# Patient Record
Sex: Male | Born: 1955 | Race: White | Hispanic: No | Marital: Married | Smoking: Never smoker
Health system: Southern US, Community
[De-identification: ages and names within clinical notes are randomized; demographics above are authoritative.]

## PROBLEM LIST (undated history)

## (undated) DIAGNOSIS — K602 Anal fissure, unspecified: Secondary | ICD-10-CM

## (undated) DIAGNOSIS — Z9189 Other specified personal risk factors, not elsewhere classified: Secondary | ICD-10-CM

## (undated) DIAGNOSIS — K219 Gastro-esophageal reflux disease without esophagitis: Secondary | ICD-10-CM

## (undated) HISTORY — DX: Gastro-esophageal reflux disease without esophagitis: K21.9

## (undated) HISTORY — PX: KNEE CARTILAGE SURGERY: SHX688

## (undated) HISTORY — DX: Other specified personal risk factors, not elsewhere classified: Z91.89

## (undated) HISTORY — PX: BACK SURGERY: SHX140

## (undated) HISTORY — DX: Anal fissure, unspecified: K60.2

---

## 2004-01-06 ENCOUNTER — Ambulatory Visit (HOSPITAL_COMMUNITY): Admission: RE | Admit: 2004-01-06 | Discharge: 2004-01-06 | Payer: Self-pay | Admitting: Orthopedic Surgery

## 2009-05-31 ENCOUNTER — Emergency Department (HOSPITAL_COMMUNITY): Admission: EM | Admit: 2009-05-31 | Discharge: 2009-05-31 | Payer: Self-pay | Admitting: Family Medicine

## 2009-06-15 ENCOUNTER — Ambulatory Visit: Payer: Self-pay | Admitting: Family Medicine

## 2009-06-15 DIAGNOSIS — Z9189 Other specified personal risk factors, not elsewhere classified: Secondary | ICD-10-CM | POA: Insufficient documentation

## 2009-06-26 ENCOUNTER — Telehealth: Payer: Self-pay | Admitting: Family Medicine

## 2009-07-10 ENCOUNTER — Telehealth: Payer: Self-pay | Admitting: Family Medicine

## 2009-07-10 ENCOUNTER — Ambulatory Visit: Payer: Self-pay | Admitting: Family Medicine

## 2009-07-10 DIAGNOSIS — K602 Anal fissure, unspecified: Secondary | ICD-10-CM

## 2009-07-10 DIAGNOSIS — R05 Cough: Secondary | ICD-10-CM

## 2009-07-11 ENCOUNTER — Telehealth: Payer: Self-pay | Admitting: Family Medicine

## 2009-07-12 ENCOUNTER — Telehealth: Payer: Self-pay | Admitting: Family Medicine

## 2009-07-12 ENCOUNTER — Telehealth (INDEPENDENT_AMBULATORY_CARE_PROVIDER_SITE_OTHER): Payer: Self-pay | Admitting: *Deleted

## 2009-07-13 ENCOUNTER — Ambulatory Visit: Payer: Self-pay | Admitting: Pulmonary Disease

## 2009-07-13 ENCOUNTER — Encounter: Payer: Self-pay | Admitting: Physician Assistant

## 2009-07-13 ENCOUNTER — Ambulatory Visit: Payer: Self-pay | Admitting: Gastroenterology

## 2009-07-13 ENCOUNTER — Telehealth: Payer: Self-pay | Admitting: Gastroenterology

## 2009-07-13 DIAGNOSIS — J984 Other disorders of lung: Secondary | ICD-10-CM

## 2009-07-14 ENCOUNTER — Ambulatory Visit (HOSPITAL_COMMUNITY): Admission: RE | Admit: 2009-07-14 | Discharge: 2009-07-14 | Payer: Self-pay | Admitting: Gastroenterology

## 2009-07-14 ENCOUNTER — Ambulatory Visit: Payer: Self-pay | Admitting: Gastroenterology

## 2009-07-17 ENCOUNTER — Telehealth: Payer: Self-pay | Admitting: Gastroenterology

## 2009-07-18 ENCOUNTER — Telehealth: Payer: Self-pay | Admitting: Gastroenterology

## 2009-07-18 ENCOUNTER — Ambulatory Visit: Payer: Self-pay | Admitting: Internal Medicine

## 2009-07-18 ENCOUNTER — Ambulatory Visit: Payer: Self-pay | Admitting: Pulmonary Disease

## 2009-07-21 ENCOUNTER — Telehealth: Payer: Self-pay | Admitting: Gastroenterology

## 2009-07-21 ENCOUNTER — Ambulatory Visit: Payer: Self-pay | Admitting: Gastroenterology

## 2009-07-24 ENCOUNTER — Telehealth: Payer: Self-pay | Admitting: Gastroenterology

## 2009-07-27 ENCOUNTER — Encounter: Payer: Self-pay | Admitting: Gastroenterology

## 2010-01-24 ENCOUNTER — Encounter: Payer: Self-pay | Admitting: Pulmonary Disease

## 2010-05-01 ENCOUNTER — Encounter: Payer: Self-pay | Admitting: Pulmonary Disease

## 2010-05-22 ENCOUNTER — Ambulatory Visit (HOSPITAL_COMMUNITY)
Admission: RE | Admit: 2010-05-22 | Discharge: 2010-05-22 | Payer: Self-pay | Source: Home / Self Care | Attending: Internal Medicine | Admitting: Internal Medicine

## 2010-06-24 ENCOUNTER — Encounter: Payer: Self-pay | Admitting: Pulmonary Disease

## 2010-07-04 NOTE — Progress Notes (Signed)
Summary: TRIAGE-Rectal pain  Phone Note From Other Clinic   Caller: [name] @ Dr Tawanna Cooler [phone] Call For: ANY GI DR Reason for Call: Schedule Patient Appt Summary of Call: Painful anal fissure. Would like pt worked in asap.  Initial call taken by: Leanor Kail [facility],  July 12, 2009 2:04 PM  Follow-up for Phone Call        Pt. will see Jeremy Garcia [facility] on 07-13-09 at 9am. [name] will advise him of appt/med.list/co-pay. Follow-up by: Laureen Ochs LPN,  July 12, 2009 2:18 PM

## 2010-07-04 NOTE — Assessment & Plan Note (Signed)
Summary: continued cough/dm   Vital Signs:  Patient profile:   55 year old male Weight:      179 pounds Temp:     98.6 degrees F oral BP sitting:   128 / 88  (left arm) Cuff size:   regular  Vitals Entered By: Kern Reap CMA Duncan Dull) (July 10, 2009 11:27 AM)  Reason for Visit cough  History of Present Illness: Jeremy Garcia is a 55 year old male, who comes in today for evaluation of a cough x 5 weeks.  We saw him on January 13.  At that time.  He was wheezing bringing up discolored sputum, and we felt he most likely had a viral syndrome with secondary asthma.  He was treated with prednisone and Biaxin improved however, the cough has persisted.  He has no fever, earache, sore throat.  Sometimes, he will have some discolored sputum.  He has no facial pain.  No history of reflux esophagitis.  He had a rectal fissure for many years.  Recently is getting worse.  I recommend general surgery consult for definitive therapy.  Environmental review of systems negative except for two cats x 8 years   both the sinus films and chest x-ray were normal.  There was a pulmonary nodule in the left upper lobe.  No referred this patient to Dr. Maple Hudson for further evaluation and also get his feedback to see if the CT scan is necessary or follow-up chest x-ray would be adequate  Preventive Screening-Counseling & Management  Alcohol-Tobacco     Smoking Status: never  Caffeine-Diet-Exercise     Does Patient Exercise: yes      Drug Use:  no.    Allergies: No Known Drug Allergies  Past History:  Past medical, surgical, family and social histories (including risk factors) reviewed, and no changes noted (except as noted below).  Past Medical History: Reviewed history from 06/15/2009 and no changes required. hx of Chicken Pox  Past Surgical History: Reviewed history from 06/15/2009 and no changes required. L knee medial meniscus repair-2006 Back [phone]  Family History: Reviewed history from  06/15/2009 and no changes required. CAD-mother triple bypass age 38 HTN-brother DM-no STROKE-nO COLON CA-no PROSTATE CA-father dx:age 72  Social History: Reviewed history and no changes required. Married Never Smoked Alcohol use-no Drug use-no Regular exercise-yes Drug Use:  no Does Patient Exercise:  yes  Review of Systems      See HPI  Physical Exam  General:  Well-developed,well-nourished,in no acute distress; alert,appropriate and cooperative throughout examination Head:  Normocephalic and atraumatic without obvious abnormalities. No apparent alopecia or balding. Eyes:  No corneal or conjunctival inflammation noted. EOMI. Perrla. Funduscopic exam benign, without hemorrhages, exudates or papilledema. Vision grossly normal. Ears:  External ear exam shows no significant lesions or deformities.  Otoscopic examination reveals clear canals, tympanic membranes are intact bilaterally without bulging, retraction, inflammation or discharge. Hearing is grossly normal bilaterally. Nose:  External nasal examination shows no deformity or inflammation. Nasal mucosa are pink and moist without lesions or exudates. Mouth:  Oral mucosa and oropharynx without lesions or exudates.  Teeth in good repair. Neck:  No deformities, masses, or tenderness noted. Chest Wall:  No deformities, masses, tenderness or gynecomastia noted. Lungs:  Normal respiratory effort, chest expands symmetrically. Lungs are clear to auscultation, no crackles or wheezes.   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 Once daily 2)  Aspirin 81 Mg  Tbec (Aspirin) .... Once daily 3)  Grape Seed Xtra Caps (Misc natural products) .... Once daily 4)  Nupercainal 1 % Oint (Dibucaine) .... Apply at bedtime  Patient Instructions: 1)  I will call you  the report on your sinus and chest x-ray. 2)  Because there might be an element of allergy take plain Claritin 10 mg daily to see if this helps the cough. 3)  I would call the general surgery office get an appointment to see Dr. Jamey Ripa, Marolyn Haller about the rectal fissure Prescriptions: NUPERCAINAL 1 % OINT (DIBUCAINE) apply at bedtime  #30 gr x 1   Entered and Authorized by:   Roderick Pee MD   Signed by:   Roderick Pee MD on 07/10/2009   Method used:   [facility] [phone][facility]       [address] [place], [place]  [address]       Ph: [phone] or [phone]       Fax: [phone]   RxID:   [id number]

## 2010-07-04 NOTE — Assessment & Plan Note (Signed)
Summary: new to est//ccm   Vital Signs:  Patient profile:   55 year old male Height:      70 inches Weight:      182 pounds BMI:     26.21 Pulse rate:   89 / minute BP sitting:   122 / 70  (left arm)  Vitals Entered By: Doristine Devoid (June 15, 2009 12:01 PM) CC: NEW EST- cough x3 weeks    CC:  NEW EST- cough x3 weeks .  History of Present Illness: Jeremy Garcia is a 55 y/o married male orig. 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  He is always in excellent health.  Has had no chronic health problems.  He had a tear in his left medial meniscus repaired in 2006 and lumbar back surgery 1999.  He takes no medication on a regular basis except an aspirin tablet.  He also takes a stool softener daily because he has a history of rectal fissure, which constipation will cause a flare of her does not smoke or drinks and occasional drink of alcohol.  Review of systems negative...Marland KitchenMarland KitchenMarland Kitchen  Preventive Screening-Counseling & Management  Alcohol-Tobacco     Smoking Status: never  Caffeine-Diet-Exercise     Caffeine use/day: yes  Allergies (verified): No Known Drug Allergies  Past History:  Past Medical History: hx of Chicken Pox  Past Surgical History: L knee medial meniscus repair-2006 Back L5-S1-1996  Family History: CAD-mother triple bypass age 93 HTN-brother DM-no STROKE-nO COLON CA-no PROSTATE CA-father dx:age 66  Social History: Alcohol:  Less than 3 drinks per week Caffeine use/day:  yes Smoking Status:  never  Physical Exam  General:  Well-developed,well-nourished,in no acute distress; alert,appropriate and cooperative throughout examination Head:  Normocephalic and atraumatic without obvious abnormalities. No apparent alopecia or balding. Eyes:  No corneal or conjunctival  inflammation noted. EOMI. Perrla. Funduscopic exam benign, without hemorrhages, exudates or papilledema. Vision grossly normal. Ears:  External ear exam shows no significant lesions or deformities.  Otoscopic examination reveals clear canals, tympanic membranes are intact bilaterally without bulging, retraction, inflammation or discharge. Hearing is grossly normal bilaterally. Nose:  External nasal examination shows no deformity or inflammation. Nasal mucosa are pink and moist without lesions or exudates. Mouth:  Oral mucosa and oropharynx without lesions or exudates.  Teeth in good repair. Neck:  No deformities, masses, or tenderness noted. Chest Wall:  No deformities, masses, tenderness or gynecomastia noted. Lungs:  Normal respiratory effort, chest expands symmetrically. Lungs are clear to auscultation, no crackles or wheezes.   Problems:  Medical Problems Added: 1)  Dx of Cough  (ICD-786.2) 2)  Dx of Chickenpox, Hx of  (ICD-V15.9)  Impression & Recommendations:  Problem # 1:  COUGH (ICD-786.2) Assessment New  Orders: Prescription Created Electronically [phone])  Complete Medication List: 1)  Multivitamins Caps (Multiple vitamin) .... Once daily 2)  Aspirin 81 Mg Tbec (Aspirin) .... Once daily 3)  Grape Seed Xtra Caps (Misc natural products) .... Once daily 4)  Prednisone 20 Mg Tabs (Prednisone) .... Uad  Patient Instructions: 1)  drink 30 ounces of water daily, run  a vaporizer or humidifier in y  bedroom at night, begin prednisone two tabs x 3 days, one x 3 days, a half x 3 days, then half a tablet Monday, Wednesday, Friday, for a two-week taper.  Return p.r.n.Marland Kitchen 2)  Return October 2011........ annual exam Prescriptions: PREDNISONE 20 MG TABS (PREDNISONE) UAD  #30 x 1   Entered and Authorized by:   Roderick Pee MD   Signed by:   Roderick Pee MD on 06/15/2009   Method used:   [facility]        [facility]  [address] [facility] [phone][facility] ([facility]ail[facility]       [address]        [place], [place]  [address]       Ph: [phone] or [phone]       Fax: [phone]   RxID:   [id number] PREDNISONE 20 MG TABS (PREDNISONE) UAD  #30 x 1   Entered and Authorized by:   Roderick Pee MD   Signed by:   Roderick Pee MD on 06/15/2009   Method used:   Print then Give to Patient   RxID:   [id number]

## 2010-07-04 NOTE — Procedures (Signed)
Summary: Flexible Sigmoidoscopy  Patient: Jeremy Garcia Note: All result statuses are Final unless otherwise noted.  Tests: (1) Flexible Sigmoidoscopy (FLX)  FLX Flexible Sigmoidoscopy                             DONE     [facility]     [address] [place], [place]  [address]           FLEXIBLE SIGMOIDOSCOPY PROCEDURE REPORT           PATIENT:  Jeremy Garcia, Jeremy Garcia  MR#:  [id number]     BIRTHDATE:  06/11/1955, 53 yrs. old  GENDER:  male           ENDOSCOPIST:  Barbette Hair. Arlyce Dice, MD     Referred by:           PROCEDURE DATE:  07/14/2009     PROCEDURE:  Flexible Sigmoidoscopy with Submucosal Injection     ASA CLASS:  Class I     INDICATIONS:  anal fissure           MEDICATIONS:   Fentanyl 100 mcg, Versed 8 mg IV           DESCRIPTION OF PROCEDURE:   After the risks benefits and     alternatives of the procedure were thoroughly explained, informed     consent was obtained.  Digital rectal exam was performed and     revealed sentinel pile.   The EC-[id number] ([id number]) and EC-[id number]     ([id number]) endoscope was introduced through the anus and advanced     to the sigmoid colon, without limitations.  The quality of the     prep was .  The instrument was then slowly withdrawn as the mucosa     was fully examined.     <<PROCEDUREIMAGES>>           Internal hemorrhoids were found (see image001).  A fissure was     found. Anal fissure posterior midline. Proximal and distal end of     fissure were injected with 0 (see image002).5cc (10 units) botox     Retroflexion was not performed.  The scope was then withdrawn from     the patient and the procedure terminated.           COMPLICATIONS:  None           ENDOSCOPIC IMPRESSION:     1) Internal hemorrhoids     2) Anal Fissure - s/p botox injection     RECOMMENDATIONS:     1) continue current meds     2) Call the office to schedule a followup office visit for 2-3     weeks           REPEAT EXAM:  No        ______________________________     Barbette Hair. Arlyce Dice, MD           CC:  Roderick Pee, MD           n.     Rosalie Doctor:   Barbette Hair. Ciarah Peace at 07/14/2009 01:24 PM           Dionicia Abler, [phone]  Note: An exclamation mark (!) indicates a result that was not dispersed into the flowsheet. Document Creation Date: 07/14/2009 1:25 PM _______________________________________________________________________  (1) Order result status: Final Collection or observation date-time: 07/14/2009 13:20 Requested date-time:  Receipt date-time:  Reported date-time:  Referring Physician:   Ordering Physician: Melvia Heaps [phone]) Specimen Source:  Source: Launa Grill Order Number: [id number] Lab site:

## 2010-07-04 NOTE — Procedures (Signed)
Summary: Instructions for procedure/[facility] (out pt)  Instructions for procedure/[facility] (out pt)   Imported By: Sherian Rein 07/17/2009 14:52:07  _____________________________________________________________________  External Attachment:    Type:   Image     Comment:   External Document

## 2010-07-04 NOTE — Letter (Signed)
Summary: [facility]  [address] [place], [place] [address]   Phone: [phone]  Fax: [phone]       Jayvian LEVI    [birth year]    MRN: [id number]        Procedure Day /Date: 07-14-09     Arrival Time: 11:45 AM     Procedure Time: 1:00 PM     Location of Procedure:                     Juliann Pares     [facility] ( Outpatient Registration)   PREPARATION FOR FLEXIBLE SIGMOIDOSCOPY WITH MAGNESIUM CITRATE  Prior to the day before your procedure, purchase one 8 oz. bottle of Magnesium Citrate and one Fleet Enema from the laxative section of your drugstore.  _________________________________________________________________________________________________  THE DAY BEFORE YOUR PROCEDURE             DATE:07-13-09      ONG:EXBMWUXL  1.   Have a clear liquid dinner the night before your procedure.  2.   Do not drink anything colored red or purple.  Avoid juices with pulp.  No orange juice.              CLEAR LIQUIDS INCLUDE: Water Jello Ice Popsicles Tea (sugar ok, no milk/cream) Powdered fruit flavored drinks Coffee (sugar ok, no milk/cream) Gatorade Juice: apple, white grape, white cranberry  Lemonade Clear bullion, consomm, broth Carbonated beverages (any kind) Strained chicken noodle soup Hard Candy   3.   At 7:00 pm the night before your procedure, drink one bottle of Magnesium Citrate over ice.  4.   Drink at least 3 more glasses of clear liquids before bedtime (preferably juices).  5.   Results are expected usually within 1 to 6 hours after taking the Magnesium Citrate.  ___________________________________________________________________________________________________  THE DAY OF YOUR PROCEDURE            DATE: 07-14-09   DAY: Friday  1.   Use Fleet Enema one hour prior to coming for procedure.  2.   You may drink clear liquids until 9:00 AM  (4 hours before exam)       MEDICATION INSTRUCTIONS  Unless  otherwise instructed, you should take regular prescription medications with a small sip of water as early as possible the morning of your procedure.       OTHER INSTRUCTIONS  You will need a responsible adult at least 55 years of age to accompany you and drive you home.   This person must remain in the waiting room during your procedure.  Wear loose fitting clothing that is easily removed.  Leave jewelry and other valuables at home.  However, you may wish to bring a book to read or an iPod/MP3 player to listen to music as you wait for your procedure to start.  Remove all body piercing jewelry and leave at home.  Total time from sign-in until discharge is approximately 2-3 hours.  You should go home directly after your procedure and rest.  You can resume normal activities the day after your procedure.  The day of your procedure you should not:   Drive   Make legal decisions   Operate machinery   Drink alcohol   Return to work  You will receive specific instructions about eating, activities and medications before you leave.   The above instructions have been reviewed and explained to me by   _______________________    I fully understand  and can verbalize these instructions _____________________________ Date _________

## 2010-07-04 NOTE — Progress Notes (Signed)
Summary: continued cough  Phone Note Outgoing Call   Caller: Patient Call For: Roderick Pee MD Reason for Call: Acute Illness Complaint: Cough/Sore throat Action Taken: Provider Notified Summary of Call: Pt is still complaining of cough and states the Prednisone did not help.  [facility]  [phone] Initial call taken by: Lynann Beaver CMA,  June 26, 2009 1:51 PM Summary of Call: GS took the prednisone.  The did really help his cough.  He has no fever, facial pain, etc. no history of reflux esophagitis.  Advised to stop the prednisone begin Biaxin 500 mg b.i.d. for 10 days ov if  the symptoms persist after that Initial call taken by: Roderick Pee MD,  June 26, 2009 2:43 PM    New/Updated Medications: BIAXIN 500 MG TABS (CLARITHROMYCIN) Take 1 tablet by mouth two times a day Prescriptions: BIAXIN 500 MG TABS (CLARITHROMYCIN) Take 1 tablet by mouth two times a day  #20 x 0   Entered and Authorized by:   Roderick Pee MD   Signed by:   Roderick Pee MD on 06/26/2009   Method used:   [facility]        [facility]  [address] [phone][facility] ([facility]ail[facility]       [address] [place], [place]  [address]       Ph: [phone] or [phone]       Fax: [phone]   RxID:   [id number]

## 2010-07-04 NOTE — Assessment & Plan Note (Signed)
Summary: consult for pulmonary nodule.   Copy to:  Kelle Darting Primary Provider/Referring Provider:  Roderick Pee MD  CC:  Pulmonary Consult.  History of Present Illness: The pt is a 55y/o male who I have been asked to see for an incidental pulmonary nodule.  The pt has had a cough since Christmas, and it started as a typical URI.  He had discolored mucus at the time, and he was treated with abx.  The cough has almost totally resolved, and is dry currently.  The pt did have a cxr recently which was clear except for an incidental pulmonary nodule in LUL.  The pt grew up in [place], and is unable to remember when was the last time he had a cxr.  He has no h/o lung disease, and his health maintenance is up to date.  He has no h/o prior cancer.  He has no h/o TB exposure, and is unsure of his ppd status.  His only unsual travel was a mission trip to [place] 2 yrs ago.  He did not become ill on this trip.  Medications Prior to Update: 1)  Multivitamins  Caps (Multiple Vitamin) .... Once Daily 2)  Aspirin 81 Mg Tbec (Aspirin) .... Once Daily 3)  Grape Seed Xtra  Caps (Misc Natural Products) .... Once Daily 4)  Nupercainal 1 % Oint (Dibucaine) .... 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 L knee medial meniscus repair-2006 Back [phone]  Family History: Reviewed history from 06/15/2009 and no changes required. CAD-mother triple bypass age 70 HTN-brother DM-no STROKE-nO COLON CA-no PROSTATE CA-father dx:age 36  Social History: Reviewed history from 07/10/2009 and no changes required. Married smoked "very casually" in highschool x 1 year.  30+ yrs ago.  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  The patient denies shortness of breath with activity, shortness of breath at rest, productive cough, coughing up blood, chest pain, irregular heartbeats, acid heartburn, indigestion, loss of appetite, weight change, abdominal pain, difficulty swallowing, sore throat, tooth/dental problems, headaches, nasal congestion/difficulty breathing through nose, sneezing, itching, ear ache, anxiety, depression, hand/feet swelling, joint stiffness or pain, rash, change in color of mucus, and fever.    Vital Signs:  Patient profile:   55 year old male Height:      70 inches Weight:      180.50 pounds BMI:     25.99 O2 Sat:      97 % on Room air Temp:     98.2 degrees F oral Pulse rate:   84 / minute BP sitting:   140 / 80  (right arm) Cuff size:   regular  Vitals Entered By: Arman Filter LPN (July 13, 2009 10:21 AM)  O2 Flow:  Room air CC: Pulmonary Consult Comments Medications reviewed with patient Arman Filter LPN  July 13, 2009 10:23 AM     Physical Exam  General:  wd male in nad  Eyes:  PERRLA and EOMI.   Nose:  patent without discharge Mouth:  clear  Neck:  no jvd, tmg, LN Lungs:  totally clear to auscultation Heart:  rrr, no mrg Abdomen:  soft and nontender, bs+ Extremities:  no edema, pulses intact distally Neurologic:  alert  and oriented, moves all 4.   Impression & Recommendations:  Problem # 1:  PULMONARY NODULE, LEFT UPPER LOBE (ICD-518.89) the pt has what appears to be a pulmonary nodule in the LUL, but it is possible it is artifactual, and related to bone.  I have explained to pt that he is in a very low risk category, and the nodule is very small.  There are no obvious old cxr's for comparison.  I would recommend following this with surveillance cxr's over a period time, however the pt and his wife are concerned and would like to get a ct chest for re-assurance.  I am ok with this, and would also put the issue to rest rather  this is in the lung or bone.  I would like to check his ppd status if this is indeed in the lung.  Problem # 2:  COUGH (ICD-786.2) the pt's cough has significantly improved, and most c/w a cyclical cough.  I have re-assured him it is not related to his cxr finding.  I have asked him to try various behavioral therapies, and also chlorpheniramine at bedtime for a little while.  Other Orders: Consultation Level IV ([address]) Radiology Referral (Radiology)  Patient Instructions: 1)  will schedule for ct chest.  I will call you with results. 2)  try chlorpheniramine 8mg  at bedtime in the place of claritin 3)  hard candy during the day to bathe the back of your throat.  Limit voice use, no throat clearing. 4)  please come in next week to have ppd place.  Call ahead to let us know when you are coming.

## 2010-07-04 NOTE — Progress Notes (Signed)
Summary: TRIAGE-sooner appt  Phone Note Call from Patient Call back at Home Phone [phone]   Caller: Patient Call For: Arlyce Dice Reason for Call: Talk to Nurse Summary of Call: Patient wants to be seen 2 wks after his procedure (2-11) but next available is on 3-8 and he'll be out of town. Initial call taken by: Tawni Levy,  July 17, 2009 12:12 PM  Follow-up for Phone Call        Pt. will see Dr.Kaplan on 07-28-09 at 2:30pm. Pt. instructed to call back as needed.  Follow-up by: Laureen Ochs LPN,  July 17, 2009 2:18 PM

## 2010-07-04 NOTE — Progress Notes (Signed)
Summary: TRIAGE  Phone Note Call from Patient Call back at Home Phone [phone]   Call For: Jeremy Garcia, [place] Summary of Call: Wonders if he can start his prep at 7pm instead of 9pm today. Has a meeting at work and if he can not make the meeting he must tell his superioor asap. Initial call taken by: Leanor Kail [facility],  July 13, 2009 12:29 PM  Follow-up for Phone Call        Spoke w/pt., he may start prep. at 9pm instead of 7pm tonight. He will call back as needed. Follow-up by: Laureen Ochs LPN,  July 13, 2009 1:35 PM

## 2010-07-04 NOTE — Assessment & Plan Note (Signed)
Summary: RECTAL FISSURE               NEW TO GI           DEBORAH   History of Present Illness Visit Type: Initial Consult Primary GI MD: Melvia Heaps MD [facility] Requesting Provider: Kelle Darting, MD Chief Complaint: Pt staes she has a lot of rectal pain x 2 months. Pt denies any rectal bleeding. He states he has seen some blood a while back but does not remember when. Pt  states the pain has been ongoing for several months but has been getting progressivley worse.  History of Present Illness:   55 YO MALE NEW TO GI TODAY,REFERRED BY DR. TODD. HE HAS HAD A SCREENING COLONOSCOPY ABOUT 3 YEARS AGO BY HIS REPORT,HERE IN [place] BUT DOES NOT REMEMBER WHO HE SAW ( NO RECORD IN E CHART/CORI)  HE COMES HERE TODAY WITH C/O  A NONHEALING FISSURE. HE HAS HAD ANAL FISSURES IN THE PAST WHICH HAVE HEALED. THIS TIME HE HAS HAS SXS FOR 2.5 MONTHS, AND IS NO BETTER. HE IS UNCOMFORTABLE SITTING,USING A DONUT AT WORK. HE IS NOT HAVING ANY BLEEDING. PAIN OBVIOUSLY WORSE POST BMS. HE HAS BEEN DOING SITZ BATHS , AND HAS NUPERCAINE OINTMENT , BUT ON FURTHER QUESTIONING HE HAS ONLY BEEN APPLYING IT EXTERNALLY. HE DENIES ABDOMINAL PAIN, CHANGE IN BOWEL HABITS ETC.   GI Review of Systems      Denies abdominal pain, acid reflux, belching, bloating, chest pain, dysphagia with liquids, dysphagia with solids, heartburn, loss of appetite, nausea, vomiting, vomiting blood, weight loss, and  weight gain.      Reports anal fissure,rectal bleeding, and  rectal pain.     Denies black tarry stools, change in bowel habit, constipation, diarrhea, diverticulosis, fecal incontinence, heme positive stool, hemorrhoids, irritable bowel syndrome, jaundice, light color stool, and  liver problems.    Current Medications (verified): 1)  Multivitamins  Caps (Multiple Vitamin) .... Once Daily 2)  Aspirin 81 Mg Tbec (Aspirin) .... Once Daily 3)  Grape Seed Xtra  Caps (Misc Natural Products) .... Once Daily 4)  Nupercainal 1 % Oint  (Dibucaine) .... Apply At Bedtime  Allergies (verified): No Known Drug Allergies  Past History:  Past Medical History: HX OF ANAL FISSURE  Past Surgical History: Reviewed history from 06/15/2009 and no changes required. L knee medial meniscus repair-2006 Back [phone]  Family History: Reviewed history from 06/15/2009 and no changes required. CAD-mother triple bypass age 78 HTN-brother DM-no STROKE-nO COLON CA-no PROSTATE CA-father dx:age 40  Social History: Reviewed history from 07/10/2009 and no changes required. Married Never Smoked Alcohol use-no Drug use-no Regular exercise-yes  Review of Systems  The patient denies allergy/sinus, anemia, anxiety-new, arthritis/joint pain, back pain, blood in urine, breast changes/lumps, change in vision, confusion, cough, coughing up blood, depression-new, fainting, fatigue, fever, headaches-new, hearing problems, heart murmur, heart rhythm changes, itching, menstrual pain, muscle pains/cramps, night sweats, nosebleeds, pregnancy symptoms, shortness of breath, skin rash, sleeping problems, sore throat, swelling of feet/legs, swollen lymph glands, thirst - excessive , urination - excessive , urination changes/pain, urine leakage, vision changes, and voice change.         OTHERWISE AS IN HPI  Vital Signs:  Patient profile:   55 year old male Height:      70 inches Weight:      178 pounds BMI:     25.63 Pulse rate:   100 / minute Pulse rhythm:   regular BP sitting:   118 / 70  (  right arm) Cuff size:   regular  Vitals Entered By: Christie Nottingham CMA Duncan Dull) (July 13, 2009 8:58 AM)   Physical Exam  General:  Well developed, well nourished, no acute distress. Head:  Normocephalic and atraumatic. Eyes:  PERRLA, no icterus. Lungs:  Clear throughout to auscultation. Heart:  Regular rate and rhythm; no murmurs, rubs,  or bruits. Abdomen:  SOFT, NONTENDER, NO MASS OR HSM,BS+ Rectal:  VERY TENDER ON EXAM,UNABLE TO INSERT  EXAMINING FINGER INTO RECTUM,BELIEVE CAN FEEL A POSTERIOR FISSURE,NO TAG,STOOL NEGATIVE Extremities:  No clubbing, cyanosis, edema or deformities noted. Neurologic:  Alert and  oriented x4;  grossly normal neurologically. Psych:  Alert and cooperative. Normal mood and affect.   Impression & Recommendations:  Problem # 1:  RECTAL FISSURE (ICD-565.0) Assessment New 55 YO MALE WITH SYMPTOMATIC RECTAL PAIN X 2 MONTHS, HX OF RECURRENT ANAL FISSURES.  CONTINUE SITZ BATHS INSRUCTED TO APPLY THE NUPERCAINE GEL INSIDE THE ANUS WITH A GLOVE -2-3 TIMES DAILY SCHEDULE FOR FLEXIBLE SIGMOIDOSCOPYWITH BOTOX INJECTION WITH DR Leeroy Bock DISCUSSED IN DETAIL WITH THE PATIENT OBTAIN OLD RECORDS OF PRIOR COLONOSCOPY ANUSOL HC SUPP AT BEDTIME X 2 WEEKS THE AS NEEDED. Orders: ZFLEX (ZFL)  Patient Instructions: 1)  We have scheduled the Flexible Sigmoidoscopy with Dr. Melvia Heaps for tomorrow 07-14-09.  This will be done at [facility]. 2)  Flexible Sigmoidoscopy instructions and brochure provided. 3)  We have a perscritpion  on hold at your pharmacy for Anusol [facility] Suppositories.  4)  Copy sent to : Dr. Kelle Darting

## 2010-07-04 NOTE — Progress Notes (Signed)
Summary: referral   Phone Note Call from Patient Call back at Home Phone [phone]   Summary of Call: Referal to [facility] for rectal fissure.  My insurance won't anyone around here, but will cover [facility].  In a lot of pain.  Having to sit on a donut.   Says his insurance covers Dr. Jerene Canny [phone], [facility]; at [facility] Dr. Glynis Smiles [phone] Colon & Rectal Surgery; at [facility] Dr. Rosalyn Charters [phone] Colon & Rectal Surgery Initial call taken by: Rudy Jew, RN,  July 11, 2009 8:47 AM  Follow-up for Phone Call        please set him up with a consult to ASAP with our GI folks try to get an appointment today.  Rectal fissure, severe pain Follow-up by: Roderick Pee MD,  July 11, 2009 9:19 AM

## 2010-07-04 NOTE — Progress Notes (Signed)
Summary: Surgical Referral  Phone Note Call from Patient Call back at Home Phone [phone]   Caller: Patient Call For: Arlyce Dice Reason for Call: Talk to Nurse Complaint: Chest Pain Summary of Call: Patient calling with a list of surgeons he can see for his rectal fissure Initial call taken by: Tawni Levy,  July 24, 2009 2:18 PM  Follow-up for Phone Call        Surgeons pt. can see,according to his insurance- Dr.Stan Toni Arthurs [facility]) [phone] Dr.John Beluga ([facility]) [phone] Dr.Christopher Mantyh ([facility]) [phone] Dr.Julie Thacker ([facility]) [phone]  DR.KAPLAN: Pt. wants to know which MD you recommend him to see.  Follow-up by: Laureen Ochs LPN,  July 24, 2009 2:41 PM  Additional Follow-up for Phone Call Additional follow up Details #1::        I don't personally know any of these physicians. Additional Follow-up by: Louis Meckel MD,  July 24, 2009 3:03 PM    Additional Follow-up for Phone Call Additional follow up Details #2::    Pt. will see Dr[facility]Robinson at [facility] on 07-27-09 at 8:30am. He has been instructed to take 2 fleets enemas 1 hour before leaving for the appt. They will mail pt. an information packet. All records have been faxed to [phone] Attn: Burna Mortimer.Pt. instructed to call back as needed.  Follow-up by: Laureen Ochs LPN,  July 24, 2009 3:26 PM   Appended Document: Surgical Referral F/U note--Pt. had surgery on 08-01-09.

## 2010-07-04 NOTE — Progress Notes (Signed)
Summary: Pt needing to get another referral for doctor re: rectal fisure  Phone Note Call from Patient Call back at Home Phone [phone]   Caller: Patient Summary of Call: Pt said that the doctors Dr. Tawanna Cooler referred pt to for rectal fisure are not in network with his insurance. Pt is wanting Dr. Tawanna Cooler to refer him to a doctor that is in pts insurance network. Pt says that Dr. Tawanna Cooler could go on [facility] website, to get referrals of doctors in network.         Initial call taken by: Lucy Antigua,  July 10, 2009 2:31 PM  Follow-up for Phone Call        please call Trey Paula........... have him check  The website and fax me a list of available surgeons with phone numbers that are  in his network Follow-up by: Roderick Pee MD,  July 10, 2009 3:14 PM  Additional Follow-up for Phone Call Additional follow up Details #1::        Phone Call Completed Additional Follow-up by: Kern Reap CMA Duncan Dull),  July 10, 2009 3:27 PM

## 2010-07-04 NOTE — Progress Notes (Signed)
Summary: Triage-Rectal Pain  Phone Note Call from Patient Call back at Home Phone [phone]   Caller: Patient Call For: Arlyce Dice Reason for Call: Talk to Nurse Summary of Call: Patient states that he is still having a lot of pain after his procedure that seems to be increasing wants to know what he should do. Initial call taken by: Tawni Levy,  July 18, 2009 10:37 AM  Follow-up for Phone Call        Pt. had Flex/Botox on 07-14-09. He states the rectal pain is becomming more severe. Worse pain is after a BM. Pain is stabbing/ burning and is only relieved by lying down for 30 mimnutes. He saw some blood in his stool this morning. Stools are soft. He is doing sitz baths 4-6 times daily, using ibuprofen, Nuprucanal oint, tucks pads, nothing is helping the pain.   [facility] P[facility] ADV[facility]  Follow-up by: Laureen Ochs LPN,  July 18, 2009 10:51 AM  Additional Follow-up for Phone Call Additional follow up Details #1::        He needs pain meds; vicodin 10/650 1 tab q6h as needed #25 r/n x 1 Additional Follow-up by: Louis Meckel MD,  July 18, 2009 11:30 AM    Additional Follow-up for Phone Call Additional follow up Details #2::    Above MD orders reviewed with patient. Med. phoned to pt. pharmacy. Pt. to keep scheduled office visit 07-28-09 at 2:15pm. Pt. instructed to call back as needed.  Follow-up by: Laureen Ochs LPN,  July 18, 2009 11:46 AM  New/Updated Medications: HYDROCODONE-ACETAMINOPHEN 10-650 MG TABS (HYDROCODONE-ACETAMINOPHEN) Take one by mouth every 6 hours as needed for pain. Prescriptions: NUPERCAINAL 1 % OINT (DIBUCAINE) apply at bedtime  #30 gr x 1   Entered by:   Laureen Ochs LPN   Authorized by:   Louis Meckel MD   Signed by:   Laureen Ochs LPN on 24/40/1027   Method used:   [facility] to        [facility] [phone]* (retail)       [address]       [place], [place]  [address]       Ph: [phone] or  [phone]       Fax: [phone]   RxID:   [id number]

## 2010-07-04 NOTE — Progress Notes (Signed)
Summary: triage  Phone Note Call from Patient Call back at Home Phone [phone]   Caller: Patient Call For: Arlyce Dice Reason for Call: Talk to Nurse Summary of Call: Patient states that she is still expiriencing a lot of pain and it's ranging from a 2-3 level to a 9-10 level Initial call taken by: Tawni Levy,  July 21, 2009 2:39 PM  Follow-up for Phone Call        Pt. will come to the office right now to see Willette Cluster NP  Follow-up by: Laureen Ochs LPN,  July 21, 2009 3:06 PM

## 2010-07-04 NOTE — Assessment & Plan Note (Signed)
Summary: F/U FROM FISSURE/BOTOX, CONTINUES W/WORSENING PAIN  (DR.KAPLA...   History of Present Illness Visit Type: Follow-up Visit Primary GI MD: Melvia Heaps MD [facility] Primary Provider: Roderick Pee MD Requesting Provider: n/a Chief Complaint: F/u from rectal fissure botox. Pt has worsening pain  History of Present Illness:   Patient had Botox injection of midline posterior anal fissure last Friday. Had mild rectal pain on Saturday and Sunday followed by excruciating pain on Monday. Called office, given Hydrocodone which didn\'t really help. Doesn\'t hurt to have BM but excruciating pain 15 minutes later. Taking stool softeners. Rectal bleeding on Monday x1 but none since. No fevers. Using Nupercaine, taking 4-5 sitz baths / day.    GI Review of Systems      Denies abdominal pain, acid reflux, belching, bloating, chest pain, dysphagia with liquids, dysphagia with solids, heartburn, loss of appetite, nausea, vomiting, vomiting blood, weight loss, and  weight gain.        Denies anal fissure, black tarry stools, change in bowel habit, constipation, diarrhea, diverticulosis, fecal incontinence, heme positive stool, hemorrhoids, irritable bowel syndrome, jaundice, light color stool, liver problems, rectal bleeding, and  rectal pain.    Current Medications (verified): 1)  Multivitamins  Caps (Multiple Vitamin) .... Once Daily 2)  Aspirin 81 Mg Tbec (Aspirin) .... Once Daily 3)  Grape Seed Xtra  Caps (Misc Natural Products) .... Once Daily 4)  Nupercainal 1 % Oint (Dibucaine) .... Apply At Bedtime 5)  Hydrocodone-Acetaminophen 10-650 Mg Tabs (Hydrocodone-Acetaminophen) .... Take One By Mouth Every 6 Hours As Needed For Pain.  Allergies (verified): No Known Drug Allergies  Past History:  Past Medical History: Reviewed history from 07/13/2009 and no changes required.  RECTAL FISSURE (ICD-565.0) CHICKENPOX, HX OF (ICD-V15.9)    Past Surgical History: Reviewed history from 06/15/2009  and no changes required. L knee medial meniscus repair-2006 Back L5-S1-1996  Family History: Reviewed history from 06/15/2009 and no changes required. CAD-mother triple bypass age 75 HTN-brother DM-no STROKE-nO COLON CA-no PROSTATE CA-father dx:age 80  Social History: Reviewed history from 07/13/2009 and no changes required. Married smoked "very casually" in highschool x 1 year.  30+ yrs ago.  Alcohol use-no Drug use-no Regular exercise-yes works as a sales manager  Vital Signs:  Patient profile:   55 year old male Height:      70 inches Weight:      176 pounds BMI:     25.34 BSA:     1.98 Temp:     98 .0 degrees F oral Pulse rate:   88 / minute Pulse rhythm:   regular BP sitting:   132 / 80  (left arm) Cuff size:   regular  Vitals Entered By: Ok Anis CMA (July 21, 2009 3:36 PM)  Physical Exam  General:  Well developed, well nourished, no acute distress. Abdomen:  Abdomen soft, nontender, nondistended. No obvious masses or hepatomegaly.Normal bowel sounds.  Rectal:  Small midline posterior fissure, painful to touch. Anteriorally to the right there is an edematous area resembling inflamed hemorrhoid. No evidence of abscess. Psych:  Alert and cooperative. Normal mood and affect.   Impression & Recommendations:  Problem # 1:  RECTAL FISSURE (ICD-565.0) Assessment Deteriorated S/P Botux injection one week ago. Still in great deal of pain despite Hydrocodone, sitz baths and Nupercaine gel. Will increase pain medication, increase dose of Nupercaine gel. Continue sitz baths 3-4 times a day. He has no local surgeons in his insurance network. He will cal Dr. Marzetta Board nurse with list of surgeons  in [place] and we will make referral.   Patient Instructions: 1)  Please pick up your medications at your pharmacy.  2)  We will call you with surgeon appointment. 3)  Copy sent to : Kelle Darting, MD 4)  The medication list was reviewed and reconciled.  All changed /  newly prescribed medications were explained.  A complete medication list was provided to the patient / caregiver. Prescriptions: PERCOCET 10-650 MG TABS (OXYCODONE-ACETAMINOPHEN) take 1 tablet every 6 hours as needed for pain  #40 x 0   Entered by:   Francee Piccolo CMA (AAMA)   Authorized by:   Willette Cluster NP   Signed by:   Francee Piccolo CMA (AAMA) on 07/21/2009   Method used:   Reprint   RxID:   [id number] PERCOCET 10-650 MG TABS (OXYCODONE-ACETAMINOPHEN) take 1 tablet every 6 hours as needed for pain  #40 x 0   Entered by:   Francee Piccolo CMA (AAMA)   Authorized by:   Willette Cluster NP   Signed by:   Willette Cluster NP on 07/21/2009   Method used:   Print then Give to Patient   RxID:   [id number] XYLOCAINE JELLY 2 % GEL (LIDOCAINE HCL) apply at bedtime  #1 tube x 2   Entered by:   Francee Piccolo CMA (AAMA)   Authorized by:   Willette Cluster NP   Signed by:   Willette Cluster NP on 07/21/2009   Method used:   [facility]  [address] [facility] [phone][facility] (retail[facility]       [address] [place], [place]  [address]       Ph: [phone] or [phone]       Fax: [phone]   RxID:   [id number]

## 2010-07-04 NOTE — Miscellaneous (Signed)
Summary: Orders Update   Clinical Lists Changes  Orders: Added new Referral order of Radiology Referral (Radiology) - Signed 

## 2010-07-04 NOTE — Consult Note (Signed)
Summary: [facility]   Imported By: Sherian Rein 09/07/2009 14:18:24  _____________________________________________________________________  External Attachment:    Type:   Image     Comment:   External Document

## 2010-07-04 NOTE — Miscellaneous (Signed)
Summary: tb skin test placed  Clinical Lists Changes  Orders: Added new Service order of TB Skin Test [phone]) - Signed Added new Service order of Admin 1st Vaccine ([id number]) - Signed Observations: Added new observation of TB-PPD LOT#: [id number] (07/18/2009 15:04) Added new observation of TB-PPD EXP: 09/27/2011 (07/18/2009 15:04) Added new observation of TB-PPD BY: Vernie Murders (07/18/2009 15:04) Added new observation of TB-PPD RTE: ID (07/18/2009 15:04) Added new observation of TB-PPD DSE: 0.1 ml (07/18/2009 15:04) Added new observation of TB-PPD MFR: Sanofi Pasteur (07/18/2009 15:04) Added new observation of TB-PPD SITE: left forearm (07/18/2009 15:04) Added new observation of TB-PPD: PPD (07/18/2009 15:04)      Immunizations Administered:  PPD Skin Test:    Vaccine Type: PPD    Site: left forearm    Mfr: Sanofi Pasteur    Dose: 0.1 ml    Route: ID    Given by: Vernie Murders    Exp. Date: 09/27/2011    Lot #: [id number]  Appended Document: tb skin test placed    Clinical Lists Changes  Observations: Added new observation of TB PPDRESULT: negative (07/20/2009 11:56) Added new observation of PPD RESULT: < 5mm (07/20/2009 11:56) Added new observation of TB-PPD RDDTE: 07/20/2009 (07/20/2009 11:56)       PPD Results    Date of reading: 07/20/2009    Results: < 5mm    Interpretation: negative Arman Filter LPN  July 20, 2009 11:57 AM

## 2010-07-04 NOTE — Progress Notes (Signed)
Summary: ref  Phone Note Call from Patient   Summary of Call: Wants referral ASAP to one of the surgeons on the list.  He's in a lot of pain. Initial call taken by: Rudy Jew, RN,  July 12, 2009 11:59 AM  Follow-up for Phone Call        patient states that he would like to have a consult with GI.  He says is insurance will cover this visit.  he would liked to be seen before any surgery because he would like to be treated if possible.  pt says gi cancelled appointment in the middle of scheduling appointment because his insurance wont cover it. Follow-up by: Kern Reap CMA Duncan Dull),  July 12, 2009 12:31 PM  Additional Follow-up for Phone Call Additional follow up Details #1::        Appt Scheduled. Pt aware. Additional Follow-up by: Corky Mull,  July 12, 2009 2:24 PM

## 2010-07-11 NOTE — Letter (Signed)
Summary: [facility]   Imported By: Lester Necedah 07/05/2010 10:15:07  _____________________________________________________________________  External Attachment:    Type:   Image     Comment:   External Document

## 2010-08-29 ENCOUNTER — Other Ambulatory Visit: Payer: Self-pay | Admitting: Gastroenterology

## 2010-08-29 ENCOUNTER — Ambulatory Visit (HOSPITAL_COMMUNITY)
Admission: RE | Admit: 2010-08-29 | Discharge: 2010-08-29 | Disposition: A | Discharge status: Home / Self Care | Payer: 59 | Source: Ambulatory Visit | Attending: Gastroenterology | Admitting: Gastroenterology

## 2010-08-29 DIAGNOSIS — R05 Cough: Secondary | ICD-10-CM | POA: Insufficient documentation

## 2010-08-29 DIAGNOSIS — Z79899 Other long term (current) drug therapy: Secondary | ICD-10-CM | POA: Insufficient documentation

## 2010-08-29 DIAGNOSIS — K2970 Gastritis, unspecified, without bleeding: Secondary | ICD-10-CM | POA: Insufficient documentation

## 2010-08-29 DIAGNOSIS — K219 Gastro-esophageal reflux disease without esophagitis: Secondary | ICD-10-CM | POA: Insufficient documentation

## 2010-08-29 DIAGNOSIS — K228 Other specified diseases of esophagus: Secondary | ICD-10-CM | POA: Insufficient documentation

## 2010-08-29 DIAGNOSIS — R059 Cough, unspecified: Secondary | ICD-10-CM | POA: Insufficient documentation

## 2010-08-29 DIAGNOSIS — K2289 Other specified disease of esophagus: Secondary | ICD-10-CM | POA: Insufficient documentation

## 2010-10-19 NOTE — Op Note (Signed)
NAME:  Jeremy Garcia, Jeremy Garcia                        ACCOUNT NO.:  [id number]   MEDICAL RECORD NO.:  [id number]                   PATIENT TYPE:  AMB   LOCATION:  DAY                                  FACILITY:  [facility]   PHYSICIAN:  Almedia Balls. Ranell Patrick, M.D.              DATE OF BIRTH:  December 15, 1955   DATE OF PROCEDURE:  01/06/2004  DATE OF DISCHARGE:                                 OPERATIVE REPORT   PREOPERATIVE DIAGNOSES:  Left knee medial meniscus tear.   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   PROCEDURE:  Left knee arthroscopy, debridement of posteromedial medial  meniscus tear as well as abrasion chondroplasty of the medial femoral  condyle.   SURGEON:  Almedia Balls. Ranell Patrick, M.D.   ASSISTANT:  None.   ANESTHESIA:  General anesthesia was used.   ESTIMATED BLOOD LOSS:  Minimal.   FLUIDS REPLACED:  800 mL crystalloid.   INSTRUMENT COUNT:  Correct.   COMPLICATIONS:  None.   ANTIBIOTICS:  Perioperative antibiotics were given.   INDICATIONS FOR PROCEDURE:  The patient is Garcia 55 year old male complaining of  left medial knee pain.  The patient has had mechanical symptoms and pain  refractory to conservative management including activity modification,  therapy, injections and presents now with MRI proven meniscus tear desiring  surgical treatment. Informed consent was obtained.   DESCRIPTION OF PROCEDURE:  After an adequate level of anesthesia was  achieved, the patient positioned supine on the operating room table, the  __________ post was utilized, left leg was sterilely prepped and draped.  Standard arthroscopic portals were created in similar fashion with  infiltration in the skin with 0.25% Marcaine with epinephrine followed by  incision with an 11 blade scalpel and __________ cannula in the joint using  blunt obturators. Diagnostic arthroscopy revealed normal patellofemoral  articular cartilage  with no significant chondromalacia noted.  The medial  femoral condyle was noted to have Garcia large area of approximately 2 x 3 cm of  at least 50% near full-thickness chondral defects with loose chondral flaps.  The chondral flaps and loose cartilage was removed in an abrasion  chondroplasty technique back to stable articular cartilage. There was Garcia  complete posterior horn medial meniscus tear involving the entire posterior  horn of the medial meniscus. About 40-50% of the posterior horn of the  meniscus had to be removed with some femoral surface of the meniscus  remaining intact.  However, most of the tibial surface was debrided using  biting instruments and Garcia full radius resector. At this point, the ACL and  PCL were visualized and noted to be intact, the lateral compartment was  inspected and noted to have some grade 2-3 changes on the tibial surface but  the femoral condyle appeared near normal and the meniscus was normal. Some  hypertrophic synovium was removed from the anterior aspect of the knee  just  to facilitate exposure of the that medial femoral condyle to make sure that  we had that thoroughly debrided to its margin. Again that was about 2 x 3 cm  and appeared articulate with the tibia in mid flexion.  The patient  tolerated the procedure well and was taken to the recovery room in stable  condition.  The wounds were sutured using 4-0 Monocryl subcuticular followed  by Steri-Strips and Garcia sterile dressings.  The patient was taken to the  recovery room and went to cryotherapy.                                               Almedia Balls. Ranell Patrick, M.D.    SRN/MEDQ  D:  01/06/2004  T:  01/07/2004  Job:  [id number]

## 2010-10-31 ENCOUNTER — Telehealth: Payer: Self-pay | Admitting: Pulmonary Disease

## 2010-10-31 NOTE — Telephone Encounter (Signed)
I atcx1 no answer, no voicemail. Carron Curie, CMA

## 2010-11-01 NOTE — Telephone Encounter (Signed)
Called and spoke with pt.  Pt hasn't seen [facility] since his initial consult for pulm nodule on 07/13/2009.  [facility] had ordered pt to have a f/u CT in August, but per documentation by [facility] in [employer], pt had changed his care to [place].  Pt states this was because his insurance had changed and [facility] was no longer in network.  Pt states his ins recently changed again and [facility] is now not in network and therefore pt is going to see [facility] again (appt already scheduled for 6/6)  Pt states while being seen at [facility] he had 2 f/u CT scans done which showed no changed in nodule.  Most recent CT scan was done 6 months ago.  Pt has disc on both of these CT scans and will bring them with to his visit with [facility] on 6/6.  Pt's main question is if [facility] would like pt to have a CT scan prior to the appt with [facility] on 6/6 or wait until after [facility] evaluates him and views prior CT scans.  Pt aware [facility] out of office this afternoon and will return tomorrow.  Pt ok to wait to have message addressed by [facility] tomorrow.

## 2010-11-02 NOTE — Telephone Encounter (Signed)
Pt aware of kc's response and will keep appt for 6/7

## 2010-11-02 NOTE — Telephone Encounter (Signed)
I would like to see him first and review his old scans, then we can talk about his followup scanning.

## 2010-11-06 ENCOUNTER — Encounter: Payer: Self-pay | Admitting: Pulmonary Disease

## 2010-11-08 ENCOUNTER — Ambulatory Visit (INDEPENDENT_AMBULATORY_CARE_PROVIDER_SITE_OTHER): Payer: 59 | Admitting: Pulmonary Disease

## 2010-11-08 ENCOUNTER — Encounter: Payer: Self-pay | Admitting: Pulmonary Disease

## 2010-11-08 VITALS — BP 128/80 | HR 73 | Temp 98.1°F | Ht 71.0 in | Wt 178.4 lb

## 2010-11-08 DIAGNOSIS — J984 Other disorders of lung: Secondary | ICD-10-CM

## 2010-11-08 NOTE — Patient Instructions (Signed)
Will do a followup limited ct chest to check on your spot in left lung.  Will call you with results. If no change, will need one more scan next spring.

## 2010-11-08 NOTE — Assessment & Plan Note (Signed)
The pt has a LUL nodule on ct chest that is unchanged from 07/2009 as of 04/2010.  He is due for a followup ct chest, and I have told him the definition of benign disease is no change at 2 yrs.  Will set this up, and call him with results.

## 2010-11-08 NOTE — Progress Notes (Signed)
  Subjective:    Patient ID: Jeremy Garcia, male    DOB: 1955-08-11, 55 y.o.   MRN: [id number]  HPI The pt comes in today for f/u of his LUL pulmonary nodule.  He has not been seen since 07/2009, and had 2 f/u scans at [facility] since his visit with me which were unchanged (last 04/2010).  He has been doing well with no new symptoms.   Review of Systems  Constitutional: Negative for fever and unexpected weight change.  HENT: Negative for ear pain, nosebleeds, congestion, sore throat, rhinorrhea, sneezing, trouble swallowing, dental problem, postnasal drip and sinus pressure.   Eyes: Negative for redness and itching.  Respiratory: Positive for cough. Negative for chest tightness, shortness of breath and wheezing.   Cardiovascular: Negative for palpitations and leg swelling.  Gastrointestinal: Negative for nausea and vomiting.  Genitourinary: Negative for dysuria.  Musculoskeletal: Negative for joint swelling.  Skin: Negative for rash.  Neurological: Negative for headaches.  Hematological: Does not bruise/bleed easily.  Psychiatric/Behavioral: Negative for dysphoric mood. The patient is not nervous/anxious.        Objective:   Physical Exam Wd male in nad No purulence or discharge noted from nares. LE without edema, no cyanosis Alert, oriented, moves all 4        Assessment & Plan:

## 2010-11-13 ENCOUNTER — Ambulatory Visit (INDEPENDENT_AMBULATORY_CARE_PROVIDER_SITE_OTHER)
Admission: RE | Admit: 2010-11-13 | Discharge: 2010-11-13 | Disposition: A | Discharge status: Home / Self Care | Payer: 59 | Source: Ambulatory Visit | Attending: Pulmonary Disease | Admitting: Pulmonary Disease

## 2010-11-13 ENCOUNTER — Other Ambulatory Visit: Payer: Self-pay | Admitting: Pulmonary Disease

## 2010-11-13 DIAGNOSIS — J984 Other disorders of lung: Secondary | ICD-10-CM

## 2011-05-13 ENCOUNTER — Encounter: Payer: Self-pay | Admitting: Family Medicine

## 2014-07-21 LAB — HEMOGLOBIN A1C: Hemoglobin A1C: 5

## 2015-12-29 LAB — HM COLONOSCOPY

## 2016-08-29 LAB — LIPID PANEL
Cholesterol: 180 (ref 0–200)
HDL: 50 (ref 35–70)
LDL Cholesterol: 102
LDl/HDL Ratio: 2
Triglycerides: 140 (ref 40–160)

## 2016-08-29 LAB — BASIC METABOLIC PANEL
BUN: 17 (ref 4–21)
Creatinine: 1 (ref 0.6–1.3)
Glucose: 86
Potassium: 4.7 (ref 3.4–5.3)
Sodium: 145 (ref 137–147)

## 2016-08-29 LAB — HEPATIC FUNCTION PANEL
ALT: 34 (ref 10–40)
AST: 21 (ref 14–40)
Alkaline Phosphatase: 83 (ref 25–125)
Bilirubin, Total: 0.7

## 2016-08-29 LAB — CBC AND DIFFERENTIAL
HCT: 48 (ref 41–53)
Hemoglobin: 17 (ref 13.5–17.5)
Platelets: 211 (ref 150–399)
WBC: 5.8

## 2016-08-29 LAB — MICROALBUMIN, URINE: Microalb, Ur: 4.4

## 2016-08-29 LAB — PSA: PSA: 0.59

## 2016-08-29 LAB — TSH: TSH: 1.46 (ref 0.41–5.90)

## 2016-09-05 ENCOUNTER — Encounter: Payer: Self-pay | Admitting: Family Medicine

## 2018-11-16 ENCOUNTER — Encounter: Payer: Self-pay | Admitting: Family Medicine

## 2018-11-25 ENCOUNTER — Encounter: Payer: Self-pay | Admitting: Physical Therapy

## 2018-11-26 ENCOUNTER — Encounter: Payer: Self-pay | Admitting: Physical Therapy

## 2018-11-30 ENCOUNTER — Ambulatory Visit: Payer: 59 | Admitting: Family Medicine

## 2018-12-01 ENCOUNTER — Ambulatory Visit (INDEPENDENT_AMBULATORY_CARE_PROVIDER_SITE_OTHER): Payer: PRIVATE HEALTH INSURANCE | Admitting: Family Medicine

## 2018-12-01 ENCOUNTER — Other Ambulatory Visit: Payer: Self-pay

## 2018-12-01 ENCOUNTER — Encounter: Payer: Self-pay | Admitting: Family Medicine

## 2018-12-01 VITALS — BP 110/72 | HR 71 | Temp 98.6°F | Ht 69.5 in | Wt 172.2 lb

## 2018-12-01 DIAGNOSIS — Z131 Encounter for screening for diabetes mellitus: Secondary | ICD-10-CM | POA: Diagnosis not present

## 2018-12-01 DIAGNOSIS — Z23 Encounter for immunization: Secondary | ICD-10-CM | POA: Diagnosis not present

## 2018-12-01 DIAGNOSIS — Z1159 Encounter for screening for other viral diseases: Secondary | ICD-10-CM

## 2018-12-01 DIAGNOSIS — Z0001 Encounter for general adult medical examination with abnormal findings: Secondary | ICD-10-CM | POA: Diagnosis not present

## 2018-12-01 DIAGNOSIS — Z125 Encounter for screening for malignant neoplasm of prostate: Secondary | ICD-10-CM

## 2018-12-01 DIAGNOSIS — N529 Male erectile dysfunction, unspecified: Secondary | ICD-10-CM | POA: Diagnosis not present

## 2018-12-01 DIAGNOSIS — J309 Allergic rhinitis, unspecified: Secondary | ICD-10-CM

## 2018-12-01 DIAGNOSIS — Z13220 Encounter for screening for lipoid disorders: Secondary | ICD-10-CM | POA: Diagnosis not present

## 2018-12-01 LAB — LIPID PANEL
Cholesterol: 158 mg/dL (ref 0–200)
HDL: 50.7 mg/dL (ref >39.00)
LDL Cholesterol: 95 mg/dL (ref 0–99)
NonHDL: 107.21
Total CHOL/HDL Ratio: 3
Triglycerides: 61 mg/dL (ref 0.0–149.0)
VLDL: 12.2 mg/dL (ref 0.0–40.0)

## 2018-12-01 LAB — PSA: PSA: 0.57 ng/mL (ref 0.10–4.00)

## 2018-12-01 LAB — GLUCOSE, RANDOM: Glucose, Bld: 79 mg/dL (ref 70–99)

## 2018-12-01 MED ORDER — TADALAFIL 10 MG PO TABS: 10.0000 mg | ORAL_TABLET | Freq: Every day | ORAL | 0 refills | Status: DC | PRN

## 2018-12-01 NOTE — Progress Notes (Signed)
Chief Complaint:  Jeremy Garcia is a 63 y.o. male who presents today for his annual comprehensive physical exam and to establish care.   Assessment/Plan:  Allergic rhinitis Stable.  Continue Claritin 10 mg daily.  Erectile dysfunction Discussed treatment options.  Will start Cialis 10 mg daily as needed.  Discussed potential side effects.  BMI 25 Discussed lifestyle modifications.   Preventative Healthcare: Check hep C antibody, lipid panel and glucose. Tdap given today.   Patient Counseling(The following topics were reviewed and/or handout was given):  -Nutrition: Stressed importance of moderation in sodium/caffeine intake, saturated fat and cholesterol, caloric balance, sufficient intake of fresh fruits, vegetables, and fiber.  -Stressed the importance of regular exercise.   -Substance Abuse: Discussed cessation/primary prevention of tobacco, alcohol, or other drug use; driving or other dangerous activities under the influence; availability of treatment for abuse.   -Injury prevention: Discussed safety belts, safety helmets, smoke detector, smoking near bedding or upholstery.   -Sexuality: Discussed sexually transmitted diseases, partner selection, use of condoms, avoidance of unintended pregnancy and contraceptive alternatives.   -Dental health: Discussed importance of regular tooth brushing, flossing, and dental visits.  -Health maintenance and immunizations reviewed. Please refer to Health maintenance section.  Return to care in 1 year for next preventative visit.     Subjective:  HPI:  He has no acute complaints today.   He has had some issues with erectile dysfunction over the past couple months.  Has trouble maintaining erection.  Able to get normal erections at first.  No dysuria or hematuria.  No penile discharge.  No pain with intercourse.  Normal libido.  # Allergic Rhinitis - On loratadine 10mg  daily and tolerating well.   Lifestyle Diet: Balanced. Plenty of  fruits and vegetables.  Exercise: Walks 6-7 miles per day.   Depression screen PHQ 2/9 12/01/2018  Decreased Interest 0  Down, Depressed, Hopeless 0  PHQ - 2 Score 0    Health Maintenance Due  Topic Date Due  . Hepatitis C Screening  [birth year]  . HIV Screening  10/[birth year]/1970     ROS: Per HPI, otherwise a complete review of systems was negative.   PMH:  The following were reviewed and entered/updated in epic: Past Medical History:  Diagnosis Date  . Anal fissure   . GERD (gastroesophageal reflux disease)   . Unspecified personal history presenting hazards to health    Patient Active Problem List   Diagnosis Date Noted  . Erectile dysfunction 12/01/2018  . Allergic rhinitis 12/01/2018   Past Surgical History:  Procedure Laterality Date  . BACK SURGERY     Due to sciatica  . KNEE CARTILAGE SURGERY     Arthroscopy   Family History  Problem Relation Age of Onset  . Hypertension Brother   . Prostate cancer Father        62s  . Coronary artery disease Mother     Medications- reviewed and updated Current Outpatient Medications  Medication Sig Dispense Refill  . Loratadine 10 MG CAPS Take by mouth.    . tadalafil (CIALIS) 10 MG tablet Take 1 tablet (10 mg total) by mouth daily as needed for erectile dysfunction. 10 tablet 0   No current facility-administered medications for this visit.     Allergies-reviewed and updated No Known Allergies  Social History   Socioeconomic History  . Marital status: Married    Spouse name: Not on file  . Number of children: Not on file  . Years of education: Not on file  .  Highest education level: Not on file  Occupational History  . Occupation: Transport plannerales manager  Social Needs  . Financial resource strain: Not on file  . Food insecurity    Worry: Not on file    Inability: Not on file  . Transportation needs    Medical: Not on file    Non-medical: Not on file  Tobacco Use  . Smoking status: Never Smoker  Substance and Sexual  Activity  . Alcohol use: Yes  . Drug use: Never  . Sexual activity: Yes  Lifestyle  . Physical activity    Days per week: Not on file    Minutes per session: Not on file  . Stress: Not on file  Relationships  . Social Musicianconnections    Talks on phone: Not on file    Gets together: Not on file    Attends religious service: Not on file    Active member of club or organization: Not on file    Attends meetings of clubs or organizations: Not on file    Relationship status: Not on file  Other Topics Concern  . Not on file  Social History Narrative  . Not on file        Objective:  Physical Exam: BP 110/72 (BP Location: Left Arm, Patient Position: Sitting, Cuff Size: Normal)   Pulse 71   Temp 98.6 F (37 C) (Oral)   Ht 5' 9.5" (1.765 m)   Wt 172 lb 3.2 oz (78.1 kg)   SpO2 99%   BMI 25.06 kg/m   Body mass index is 25.06 kg/m. Wt Readings from Last 3 Encounters:  12/01/18 172 lb 3.2 oz (78.1 kg)  11/08/10 178 lb 6.4 oz (80.9 kg)   Gen: NAD, resting comfortably HEENT: TMs normal bilaterally. OP clear. No thyromegaly noted.  CV: RRR with no murmurs appreciated Pulm: NWOB, CTAB with no crackles, wheezes, or rhonchi GI: Normal bowel sounds present. Soft, Nontender, Nondistended. MSK: no edema, cyanosis, or clubbing noted Skin: warm, dry Neuro: CN2-12 grossly intact. Strength 5/5 in upper and lower extremities. Reflexes symmetric and intact bilaterally.  Psych: Normal affect and thought content     Audrick Lamoureaux M. Jimmey RalphParker, MD 12/01/2018 9:31 AM

## 2018-12-01 NOTE — Patient Instructions (Signed)
It was very nice to see you today!  Please try the Cialis.  Let me know if you have any side effects.  Keep up the good work with your diet and exercise.  We will check blood work today.   Eat at least 3 REAL meals and 1-2 snacks per day.  Aim for no more than 5 hours between eating.  Eat breakfast within one hour of getting up.    Obtain twice as many fruits/vegetables as protein or carbohydrate foods for both lunch and dinner.   Cut down on sweet beverages. This includes juice, soda, and sweet tea.    Exercise at least 150 minutes every week.    Take care, Dr Jerline Pain   Preventive Care 70-19 Years Old, Male Preventive care refers to lifestyle choices and visits with your health care provider that can promote health and wellness. This includes:  A yearly physical exam. This is also called an annual well check.  Regular dental and eye exams.  Immunizations.  Screening for certain conditions.  Healthy lifestyle choices, such as eating a healthy diet, getting regular exercise, not using drugs or products that contain nicotine and tobacco, and limiting alcohol use. What can I expect for my preventive care visit? Physical exam Your health care provider will check:  Height and weight. These may be used to calculate body mass index (BMI), which is a measurement that tells if you are at a healthy weight.  Heart rate and blood pressure.  Your skin for abnormal spots. Counseling Your health care provider may ask you questions about:  Alcohol, tobacco, and drug use.  Emotional well-being.  Home and relationship well-being.  Sexual activity.  Eating habits.  Work and work Statistician. What immunizations do I need?  Influenza (flu) vaccine  This is recommended every year. Tetanus, diphtheria, and pertussis (Tdap) vaccine  You may need a Td booster every 10 years. Varicella (chickenpox) vaccine  You may need this vaccine if you have not already been vaccinated.  Zoster (shingles) vaccine  You may need this after age 66. Measles, mumps, and rubella (MMR) vaccine  You may need at least one dose of MMR if you were born in 1957 or later. You may also need a second dose. Pneumococcal conjugate (PCV13) vaccine  You may need this if you have certain conditions and were not previously vaccinated. Pneumococcal polysaccharide (PPSV23) vaccine  You may need one or two doses if you smoke cigarettes or if you have certain conditions. Meningococcal conjugate (MenACWY) vaccine  You may need this if you have certain conditions. Hepatitis A vaccine  You may need this if you have certain conditions or if you travel or work in places where you may be exposed to hepatitis A. Hepatitis B vaccine  You may need this if you have certain conditions or if you travel or work in places where you may be exposed to hepatitis B. Haemophilus influenzae type b (Hib) vaccine  You may need this if you have certain risk factors. Human papillomavirus (HPV) vaccine  If recommended by your health care provider, you may need three doses over 6 months. You may receive vaccines as individual doses or as more than one vaccine together in one shot (combination vaccines). Talk with your health care provider about the risks and benefits of combination vaccines. What tests do I need? Blood tests  Lipid and cholesterol levels. These may be checked every 5 years, or more frequently if you are over 78 years old.  Hepatitis C  test.  Hepatitis B test. Screening  Lung cancer screening. You may have this screening every year starting at age 4 if you have a 30-pack-year history of smoking and currently smoke or have quit within the past 15 years.  Prostate cancer screening. Recommendations will vary depending on your family history and other risks.  Colorectal cancer screening. All adults should have this screening starting at age 47 and continuing until age 49. Your health care  provider may recommend screening at age 15 if you are at increased risk. You will have tests every 1-10 years, depending on your results and the type of screening test.  Diabetes screening. This is done by checking your blood sugar (glucose) after you have not eaten for a while (fasting). You may have this done every 1-3 years.  Sexually transmitted disease (STD) testing. Follow these instructions at home: Eating and drinking  Eat a diet that includes fresh fruits and vegetables, whole grains, lean protein, and low-fat dairy products.  Take vitamin and mineral supplements as recommended by your health care provider.  Do not drink alcohol if your health care provider tells you not to drink.  If you drink alcohol: ? Limit how much you have to 0-2 drinks a day. ? Be aware of how much alcohol is in your drink. In the [place] one drink equals one 12 oz bottle of beer (355 mL), one 5 oz glass of wine (148 mL), or one 1 oz glass of hard liquor (44 mL). Lifestyle  Take daily care of your teeth and gums.  Stay active. Exercise for at least 30 minutes on 5 or more days each week.  Do not use any products that contain nicotine or tobacco, such as cigarettes, e-cigarettes, and chewing tobacco. If you need help quitting, ask your health care provider.  If you are sexually active, practice safe sex. Use a condom or other form of protection to prevent STIs (sexually transmitted infections).  Talk with your health care provider about taking a low-dose aspirin every day starting at age 48. What's next?  Go to your health care provider once a year for a well check visit.  Ask your health care provider how often you should have your eyes and teeth checked.  Stay up to date on all vaccines. This information is not intended to replace advice given to you by your health care provider. Make sure you discuss any questions you have with your health care provider. Document Released: 06/16/2015 Document  Revised: 05/14/2018 Document Reviewed: 05/14/2018 Elsevier Patient Education  2020 [employer].

## 2018-12-01 NOTE — Assessment & Plan Note (Signed)
Discussed treatment options.  Will start Cialis 10 mg daily as needed.  Discussed potential side effects. 

## 2018-12-01 NOTE — Assessment & Plan Note (Signed)
Stable. Continue Claritin 10mg daily

## 2018-12-02 ENCOUNTER — Ambulatory Visit: Payer: 59 | Admitting: Family Medicine

## 2018-12-02 LAB — HEPATITIS C ANTIBODY
Hepatitis C Ab: NONREACTIVE
SIGNAL TO CUT-OFF: 0.01 (ref <1.00)

## 2018-12-03 NOTE — Progress Notes (Signed)
Dr Marigene Ehlers interpretation of your lab work:  Your blood work looks great! All results are NORMAL.  Keep up the good work and we can recheck in a year or so.   If you have any additional questions, please give [place] a call or send [place] a message through [place].  Take care, Dr Jerline Pain

## 2019-03-03 ENCOUNTER — Ambulatory Visit (INDEPENDENT_AMBULATORY_CARE_PROVIDER_SITE_OTHER): Payer: PRIVATE HEALTH INSURANCE

## 2019-03-03 ENCOUNTER — Other Ambulatory Visit: Payer: Self-pay

## 2019-03-03 ENCOUNTER — Encounter

## 2019-03-03 DIAGNOSIS — Z23 Encounter for immunization: Secondary | ICD-10-CM | POA: Diagnosis not present

## 2019-07-29 ENCOUNTER — Other Ambulatory Visit: Payer: Self-pay | Admitting: Neurological Surgery

## 2019-08-13 NOTE — Progress Notes (Signed)
[id number] - [place], [place] - [address] [place] [address] Phone: [phone] Fax: [phone]      Your procedure is scheduled on Thursday, March 18th.  Report to [facility] Main Entrance "A" at 5:30 A.M., and check in at the Admitting office.  Call this number if you have problems the morning of surgery:  [phone]  Call [phone] if you have any questions prior to your surgery date Monday-Friday 8am-4pm    Remember:  Do not eat or drink after midnight the night before your surgery     Take these medicines the morning of surgery with A SIP OF WATER   Claritin - if needed  Methocarbamol (Robaxin) - if needed  7 days prior to surgery STOP taking any Aspirin (unless otherwise instructed by your surgeon), Aleve, Naproxen, Ibuprofen, Motrin, Advil, Goody's, BC's, all herbal medications, fish oil, and all vitamins.    The Morning of Surgery  Do not wear jewelry.  Do not wear lotions, powders, colognes, or deodorant  Men may shave face and neck.  Do not bring valuables to the hospital.  [facility] is not responsible for any belongings or valuables.  If you are a smoker, DO NOT Smoke 24 hours prior to surgery  If you wear a CPAP at night please bring your mask the morning of surgery   Remember that you must have someone to transport you home after your surgery, and remain with you for 24 hours if you are discharged the same day.   Please bring cases for contacts, glasses, hearing aids, dentures or bridgework because it cannot be worn into surgery.    Leave your suitcase in the car.  After surgery it may be brought to your room.  For patients admitted to the hospital, discharge time will be determined by your treatment team.  Patients discharged the day of surgery will not be allowed to drive home.    Special instructions:   [facility]- Preparing For Surgery  Before surgery, you can play an important  role. Because skin is not sterile, your skin needs to be as free of germs as possible. You can reduce the number of germs on your skin by washing with CHG (chlorahexidine gluconate) Soap before surgery.  CHG is an antiseptic cleaner which kills germs and bonds with the skin to continue killing germs even after washing.    Oral Hygiene is also important to reduce your risk of infection.  Remember - BRUSH YOUR TEETH THE MORNING OF SURGERY WITH YOUR REGULAR TOOTHPASTE  Please do not use if you have an allergy to CHG or antibacterial soaps. If your skin becomes reddened/irritated stop using the CHG.  Do not shave (including legs and underarms) for at least 48 hours prior to first CHG shower. It is OK to shave your face.  Please follow these instructions carefully.   1. Shower the NIGHT BEFORE SURGERY and the MORNING OF SURGERY with CHG Soap.   2. If you chose to wash your hair, wash your hair first as usual with your normal shampoo.  3. After you shampoo, rinse your hair and body thoroughly to remove the shampoo.  4. Use CHG as you would any other liquid soap. You can apply CHG directly to the skin and wash gently with a scrungie or a clean washcloth.   5. Apply the CHG Soap to your body ONLY FROM THE NECK DOWN.  Do not use on open  wounds or open sores. Avoid contact with your eyes, ears, mouth and genitals (private parts). Wash Face and genitals (private parts)  with your normal soap.   6. Wash thoroughly, paying special attention to the area where your surgery will be performed.  7. Thoroughly rinse your body with warm water from the neck down.  8. DO NOT shower/wash with your normal soap after using and rinsing off the CHG Soap.  9. Pat yourself dry with a CLEAN TOWEL.  10. Wear CLEAN PAJAMAS to bed the night before surgery, wear comfortable clothes the morning of surgery  11. Place CLEAN SHEETS on your bed the night of your first shower and DO NOT SLEEP WITH PETS.    Day of  Surgery:  Please shower the morning of surgery with the CHG soap Do not apply any deodorants/lotions. Please wear clean clothes to the hospital/surgery center.   Remember to brush your teeth WITH YOUR REGULAR TOOTHPASTE.   Please read over the following fact sheets that you were given.

## 2019-08-16 ENCOUNTER — Other Ambulatory Visit (HOSPITAL_COMMUNITY)
Admission: RE | Admit: 2019-08-16 | Discharge: 2019-08-16 | Disposition: A | Discharge status: Home / Self Care | Payer: BC Managed Care – PPO | Source: Ambulatory Visit | Attending: Neurological Surgery | Admitting: Neurological Surgery

## 2019-08-16 ENCOUNTER — Encounter (HOSPITAL_COMMUNITY)
Admission: RE | Admit: 2019-08-16 | Discharge: 2019-08-16 | Disposition: A | Discharge status: Home / Self Care | Payer: BC Managed Care – PPO | Source: Ambulatory Visit | Attending: Neurological Surgery | Admitting: Neurological Surgery

## 2019-08-16 ENCOUNTER — Other Ambulatory Visit: Payer: Self-pay

## 2019-08-16 ENCOUNTER — Encounter (HOSPITAL_COMMUNITY): Payer: Self-pay

## 2019-08-16 DIAGNOSIS — Z01812 Encounter for preprocedural laboratory examination: Secondary | ICD-10-CM | POA: Insufficient documentation

## 2019-08-16 DIAGNOSIS — Z20822 Contact with and (suspected) exposure to covid-19: Secondary | ICD-10-CM | POA: Insufficient documentation

## 2019-08-16 LAB — TYPE AND SCREEN
ABO/RH(D): A POS
Antibody Screen: NEGATIVE

## 2019-08-16 LAB — BASIC METABOLIC PANEL
Anion gap: 10 (ref 5–15)
BUN: 14 mg/dL (ref 8–23)
CO2: 26 mmol/L (ref 22–32)
Calcium: 9.1 mg/dL (ref 8.9–10.3)
Chloride: 105 mmol/L (ref 98–111)
Creatinine, Ser: 0.89 mg/dL (ref 0.61–1.24)
GFR calc Af Amer: >60 mL/min (ref >60)
GFR calc non Af Amer: >60 mL/min (ref >60)
Glucose, Bld: 98 mg/dL (ref 70–99)
Potassium: 4.4 mmol/L (ref 3.5–5.1)
Sodium: 141 mmol/L (ref 135–145)

## 2019-08-16 LAB — SURGICAL PCR SCREEN
MRSA, PCR: NEGATIVE
Staphylococcus aureus: NEGATIVE

## 2019-08-16 LAB — ABO/RH: ABO/RH(D): A POS

## 2019-08-16 LAB — CBC
HCT: 49.9 % (ref 39.0–52.0)
Hemoglobin: 17.1 g/dL — ABNORMAL HIGH (ref 13.0–17.0)
MCH: 31 pg (ref 26.0–34.0)
MCHC: 34.3 g/dL (ref 30.0–36.0)
MCV: 90.6 fL (ref 80.0–100.0)
Platelets: 228 10*3/uL (ref 150–400)
RBC: 5.51 MIL/uL (ref 4.22–5.81)
RDW: 12.3 % (ref 11.5–15.5)
WBC: 6.5 10*3/uL (ref 4.0–10.5)
nRBC: 0 % (ref 0.0–0.2)

## 2019-08-16 LAB — SARS CORONAVIRUS 2 (TAT 6-24 HRS): SARS Coronavirus 2: NEGATIVE

## 2019-08-16 NOTE — Progress Notes (Signed)
PCP - Jacquiline Doe Cardiologist - denies   Chest x-ray - n/a EKG - n/a  COVID TEST- Monday 08-16-19   Anesthesia review: n/a  Patient denies shortness of breath, fever, cough and chest pain at PAT appointment   All instructions explained to the patient, with a verbal understanding of the material. Patient agrees to go over the instructions while at home for a better understanding. Patient also instructed to self quarantine after being tested for COVID-19. The opportunity to ask questions was provided.

## 2019-08-19 ENCOUNTER — Inpatient Hospital Stay (HOSPITAL_COMMUNITY): Payer: BC Managed Care – PPO | Admitting: Certified Registered Nurse Anesthetist

## 2019-08-19 ENCOUNTER — Other Ambulatory Visit: Payer: Self-pay

## 2019-08-19 ENCOUNTER — Encounter (HOSPITAL_COMMUNITY)
Admission: RE | Disposition: A | Discharge status: Home / Self Care | Payer: Self-pay | Source: Home / Self Care | Attending: Neurological Surgery

## 2019-08-19 ENCOUNTER — Encounter (HOSPITAL_COMMUNITY): Payer: Self-pay | Admitting: Neurological Surgery

## 2019-08-19 ENCOUNTER — Inpatient Hospital Stay (HOSPITAL_COMMUNITY): Payer: BC Managed Care – PPO

## 2019-08-19 ENCOUNTER — Inpatient Hospital Stay (HOSPITAL_COMMUNITY)
Admission: RE | Admit: 2019-08-19 | Discharge: 2019-08-19 | DRG: 460 | Disposition: A | Discharge status: Home / Self Care | Payer: BC Managed Care – PPO | Attending: Neurological Surgery | Admitting: Neurological Surgery

## 2019-08-19 DIAGNOSIS — M48062 Spinal stenosis, lumbar region with neurogenic claudication: Principal | ICD-10-CM | POA: Diagnosis present

## 2019-08-19 DIAGNOSIS — M479 Spondylosis, unspecified: Secondary | ICD-10-CM | POA: Diagnosis present

## 2019-08-19 DIAGNOSIS — Z419 Encounter for procedure for purposes other than remedying health state, unspecified: Secondary | ICD-10-CM

## 2019-08-19 DIAGNOSIS — M4316 Spondylolisthesis, lumbar region: Secondary | ICD-10-CM | POA: Diagnosis present

## 2019-08-19 DIAGNOSIS — M5416 Radiculopathy, lumbar region: Secondary | ICD-10-CM | POA: Diagnosis present

## 2019-08-19 DIAGNOSIS — M545 Low back pain: Secondary | ICD-10-CM | POA: Diagnosis present

## 2019-08-19 HISTORY — PX: ANTERIOR LAT LUMBAR FUSION: SHX1168

## 2019-08-19 SURGERY — ANTERIOR LATERAL LUMBAR FUSION 1 LEVEL
Anesthesia: General

## 2019-08-19 MED ORDER — BISACODYL 10 MG RE SUPP: 10.0000 mg | Freq: Every day | RECTAL | Status: DC | PRN

## 2019-08-19 MED ORDER — 0.9 % SODIUM CHLORIDE (POUR BTL) OPTIME
TOPICAL | Status: DC | PRN
  Administered 2019-08-19: 1000 mL

## 2019-08-19 MED ORDER — CEFAZOLIN SODIUM-DEXTROSE 2-4 GM/100ML-% IV SOLN
2.0000 g | INTRAVENOUS | Status: AC
  Administered 2019-08-19: 2 g via INTRAVENOUS

## 2019-08-19 MED ORDER — ACETAMINOPHEN 500 MG PO TABS
ORAL_TABLET | ORAL | Status: AC
  Administered 2019-08-19: 1000 mg via ORAL
  Filled 2019-08-19: qty 2

## 2019-08-19 MED ORDER — PHENYLEPHRINE HCL-NACL 10-0.9 MG/250ML-% IV SOLN
INTRAVENOUS | Status: DC | PRN
  Administered 2019-08-19: 40 ug/min via INTRAVENOUS

## 2019-08-19 MED ORDER — SODIUM CHLORIDE 0.9 % IV SOLN: 250.0000 mL | INTRAVENOUS | Status: DC

## 2019-08-19 MED ORDER — SODIUM CHLORIDE 0.9% FLUSH: 3.0000 mL | Freq: Two times a day (BID) | INTRAVENOUS | Status: DC

## 2019-08-19 MED ORDER — METHOCARBAMOL 500 MG PO TABS: 500.0000 mg | ORAL_TABLET | Freq: Four times a day (QID) | ORAL | Status: DC | PRN

## 2019-08-19 MED ORDER — MENTHOL 3 MG MT LOZG: 1.0000 | LOZENGE | OROMUCOSAL | Status: DC | PRN

## 2019-08-19 MED ORDER — ACETAMINOPHEN 500 MG PO TABS: 1000.0000 mg | ORAL_TABLET | Freq: Once | ORAL | Status: AC

## 2019-08-19 MED ORDER — ONDANSETRON HCL 4 MG PO TABS: 4.0000 mg | ORAL_TABLET | Freq: Four times a day (QID) | ORAL | Status: DC | PRN

## 2019-08-19 MED ORDER — BUPIVACAINE HCL (PF) 0.5 % IJ SOLN
INTRAMUSCULAR | Status: DC | PRN
  Administered 2019-08-19: 5 mL

## 2019-08-19 MED ORDER — DEXAMETHASONE SODIUM PHOSPHATE 10 MG/ML IJ SOLN
INTRAMUSCULAR | Status: AC
  Filled 2019-08-19: qty 1

## 2019-08-19 MED ORDER — CHLORHEXIDINE GLUCONATE CLOTH 2 % EX PADS: 6.0000 | MEDICATED_PAD | Freq: Once | CUTANEOUS | Status: DC

## 2019-08-19 MED ORDER — PHENYLEPHRINE 40 MCG/ML (10ML) SYRINGE FOR IV PUSH (FOR BLOOD PRESSURE SUPPORT)
PREFILLED_SYRINGE | INTRAVENOUS | Status: AC
  Filled 2019-08-19: qty 10

## 2019-08-19 MED ORDER — MORPHINE SULFATE (PF) 2 MG/ML IV SOLN
2.0000 mg | INTRAVENOUS | Status: DC | PRN
  Administered 2019-08-19: 4 mg via INTRAVENOUS
  Filled 2019-08-19: qty 2

## 2019-08-19 MED ORDER — PROPOFOL 10 MG/ML IV BOLUS
INTRAVENOUS | Status: AC
  Filled 2019-08-19: qty 40

## 2019-08-19 MED ORDER — FENTANYL CITRATE (PF) 100 MCG/2ML IJ SOLN
INTRAMUSCULAR | Status: AC
  Filled 2019-08-19: qty 2

## 2019-08-19 MED ORDER — THROMBIN 5000 UNITS EX SOLR
OROMUCOSAL | Status: DC | PRN
  Administered 2019-08-19: 5 mL via TOPICAL

## 2019-08-19 MED ORDER — BUPIVACAINE HCL (PF) 0.5 % IJ SOLN
INTRAMUSCULAR | Status: AC
  Filled 2019-08-19: qty 30

## 2019-08-19 MED ORDER — LACTATED RINGERS IV SOLN: INTRAVENOUS | Status: DC | PRN

## 2019-08-19 MED ORDER — FENTANYL CITRATE (PF) 100 MCG/2ML IJ SOLN
25.0000 ug | INTRAMUSCULAR | Status: DC | PRN
  Administered 2019-08-19: 12:00:00 25 ug via INTRAVENOUS

## 2019-08-19 MED ORDER — ACETAMINOPHEN 650 MG RE SUPP: 650.0000 mg | RECTAL | Status: DC | PRN

## 2019-08-19 MED ORDER — THROMBIN 5000 UNITS EX SOLR
CUTANEOUS | Status: AC
  Filled 2019-08-19: qty 5000

## 2019-08-19 MED ORDER — ONDANSETRON HCL 4 MG/2ML IJ SOLN
INTRAMUSCULAR | Status: DC | PRN
  Administered 2019-08-19: 4 mg via INTRAVENOUS

## 2019-08-19 MED ORDER — SODIUM CHLORIDE 0.9 % IV SOLN
INTRAVENOUS | Status: DC | PRN
  Administered 2019-08-19: 500 mL

## 2019-08-19 MED ORDER — FENTANYL CITRATE (PF) 100 MCG/2ML IJ SOLN
INTRAMUSCULAR | Status: DC | PRN
  Administered 2019-08-19 (×3): 50 ug via INTRAVENOUS
  Administered 2019-08-19: 100 ug via INTRAVENOUS

## 2019-08-19 MED ORDER — ONDANSETRON HCL 4 MG/2ML IJ SOLN
INTRAMUSCULAR | Status: AC
  Filled 2019-08-19: qty 2

## 2019-08-19 MED ORDER — SUCCINYLCHOLINE CHLORIDE 200 MG/10ML IV SOSY
PREFILLED_SYRINGE | INTRAVENOUS | Status: DC | PRN
  Administered 2019-08-19: 200 mg via INTRAVENOUS

## 2019-08-19 MED ORDER — LIDOCAINE-EPINEPHRINE 1 %-1:100000 IJ SOLN
INTRAMUSCULAR | Status: AC
  Filled 2019-08-19: qty 1

## 2019-08-19 MED ORDER — OXYCODONE-ACETAMINOPHEN 5-325 MG PO TABS: 1.0000 | ORAL_TABLET | ORAL | 0 refills | Status: DC | PRN

## 2019-08-19 MED ORDER — POLYETHYLENE GLYCOL 3350 17 G PO PACK: 17.0000 g | PACK | Freq: Every day | ORAL | Status: DC | PRN

## 2019-08-19 MED ORDER — METHOCARBAMOL 500 MG PO TABS
500.0000 mg | ORAL_TABLET | Freq: Four times a day (QID) | ORAL | Status: DC | PRN
  Administered 2019-08-19: 500 mg via ORAL
  Filled 2019-08-19: qty 1

## 2019-08-19 MED ORDER — PROPOFOL 500 MG/50ML IV EMUL
INTRAVENOUS | Status: DC | PRN
  Administered 2019-08-19: 50 ug/kg/min via INTRAVENOUS

## 2019-08-19 MED ORDER — DOCUSATE SODIUM 100 MG PO CAPS: 100.0000 mg | ORAL_CAPSULE | Freq: Two times a day (BID) | ORAL | Status: DC

## 2019-08-19 MED ORDER — FLEET ENEMA 7-19 GM/118ML RE ENEM: 1.0000 | ENEMA | Freq: Once | RECTAL | Status: DC | PRN

## 2019-08-19 MED ORDER — ONDANSETRON HCL 4 MG/2ML IJ SOLN: 4.0000 mg | Freq: Four times a day (QID) | INTRAMUSCULAR | Status: DC | PRN

## 2019-08-19 MED ORDER — PHENYLEPHRINE 40 MCG/ML (10ML) SYRINGE FOR IV PUSH (FOR BLOOD PRESSURE SUPPORT)
PREFILLED_SYRINGE | INTRAVENOUS | Status: DC | PRN
  Administered 2019-08-19: 120 ug via INTRAVENOUS

## 2019-08-19 MED ORDER — MIDAZOLAM HCL 2 MG/2ML IJ SOLN
INTRAMUSCULAR | Status: AC
  Filled 2019-08-19: qty 2

## 2019-08-19 MED ORDER — LIDOCAINE 2% (20 MG/ML) 5 ML SYRINGE
INTRAMUSCULAR | Status: DC | PRN
  Administered 2019-08-19: 60 mg via INTRAVENOUS

## 2019-08-19 MED ORDER — ROCURONIUM BROMIDE 10 MG/ML (PF) SYRINGE
PREFILLED_SYRINGE | INTRAVENOUS | Status: DC | PRN
  Administered 2019-08-19: 10 mg via INTRAVENOUS

## 2019-08-19 MED ORDER — OXYCODONE-ACETAMINOPHEN 5-325 MG PO TABS: 1.0000 | ORAL_TABLET | ORAL | Status: DC | PRN

## 2019-08-19 MED ORDER — DIAZEPAM 5 MG PO TABS: 5.0000 mg | ORAL_TABLET | Freq: Four times a day (QID) | ORAL | 0 refills | Status: DC | PRN

## 2019-08-19 MED ORDER — PHENOL 1.4 % MT LIQD: 1.0000 | OROMUCOSAL | Status: DC | PRN

## 2019-08-19 MED ORDER — LIDOCAINE-EPINEPHRINE 1 %-1:100000 IJ SOLN
INTRAMUSCULAR | Status: DC | PRN
  Administered 2019-08-19: 5 mL

## 2019-08-19 MED ORDER — METHOCARBAMOL 1000 MG/10ML IJ SOLN
500.0000 mg | Freq: Four times a day (QID) | INTRAVENOUS | Status: DC | PRN
  Filled 2019-08-19: qty 5

## 2019-08-19 MED ORDER — DEXAMETHASONE SODIUM PHOSPHATE 10 MG/ML IJ SOLN
INTRAMUSCULAR | Status: DC | PRN
  Administered 2019-08-19: 8 mg via INTRAVENOUS

## 2019-08-19 MED ORDER — ACETAMINOPHEN 325 MG PO TABS: 650.0000 mg | ORAL_TABLET | ORAL | Status: DC | PRN

## 2019-08-19 MED ORDER — CEFAZOLIN SODIUM-DEXTROSE 2-4 GM/100ML-% IV SOLN
INTRAVENOUS | Status: AC
  Filled 2019-08-19: qty 100

## 2019-08-19 MED ORDER — SENNA 8.6 MG PO TABS: 1.0000 | ORAL_TABLET | Freq: Two times a day (BID) | ORAL | Status: DC

## 2019-08-19 MED ORDER — LACTATED RINGERS IV SOLN: INTRAVENOUS | Status: DC

## 2019-08-19 MED ORDER — LORATADINE 10 MG PO TABS: 10.0000 mg | ORAL_TABLET | Freq: Every day | ORAL | Status: DC

## 2019-08-19 MED ORDER — CEFAZOLIN SODIUM-DEXTROSE 2-4 GM/100ML-% IV SOLN: 2.0000 g | Freq: Three times a day (TID) | INTRAVENOUS | Status: DC

## 2019-08-19 MED ORDER — ALUM & MAG HYDROXIDE-SIMETH 200-200-20 MG/5ML PO SUSP: 30.0000 mL | Freq: Four times a day (QID) | ORAL | Status: DC | PRN

## 2019-08-19 MED ORDER — ARTIFICIAL TEARS OPHTHALMIC OINT
TOPICAL_OINTMENT | OPHTHALMIC | Status: AC
  Filled 2019-08-19: qty 3.5

## 2019-08-19 MED ORDER — PROPOFOL 10 MG/ML IV BOLUS
INTRAVENOUS | Status: DC | PRN
  Administered 2019-08-19: 150 mg via INTRAVENOUS

## 2019-08-19 MED ORDER — MIDAZOLAM HCL 2 MG/2ML IJ SOLN
INTRAMUSCULAR | Status: DC | PRN
  Administered 2019-08-19: 2 mg via INTRAVENOUS

## 2019-08-19 MED ORDER — FENTANYL CITRATE (PF) 250 MCG/5ML IJ SOLN
INTRAMUSCULAR | Status: AC
  Filled 2019-08-19: qty 5

## 2019-08-19 MED ORDER — SODIUM CHLORIDE 0.9% FLUSH: 3.0000 mL | INTRAVENOUS | Status: DC | PRN

## 2019-08-19 SURGICAL SUPPLY — 47 items
BLADE CLIPPER SURG (BLADE) IMPLANT
BOLT 5.0X50 SPINAL (Bolt) ×6 IMPLANT
BONE MATRIX OSTEOCEL PRO MED (Bone Implant) ×3 IMPLANT
CAGE COROENT XL WIDE14X22X55 (Cage) ×3 IMPLANT
COVER WAND RF STERILE (DRAPES) ×3 IMPLANT
DERMABOND ADVANCED (GAUZE/BANDAGES/DRESSINGS) ×2
DERMABOND ADVANCED .7 DNX12 (GAUZE/BANDAGES/DRESSINGS) ×1 IMPLANT
DRAPE C-ARM 42X72 X-RAY (DRAPES) ×3 IMPLANT
DRAPE C-ARMOR (DRAPES) ×3 IMPLANT
DRAPE LAPAROTOMY 100X72X124 (DRAPES) ×3 IMPLANT
DURAPREP 26ML APPLICATOR (WOUND CARE) ×3 IMPLANT
ELECT REM PT RETURN 9FT ADLT (ELECTROSURGICAL) ×3
ELECTRODE REM PT RTRN 9FT ADLT (ELECTROSURGICAL) ×1 IMPLANT
GAUZE 4X4 16PLY RFD (DISPOSABLE) IMPLANT
GLOVE BIOGEL PI IND STRL 8 (GLOVE) ×1 IMPLANT
GLOVE BIOGEL PI IND STRL 8.5 (GLOVE) ×1 IMPLANT
GLOVE BIOGEL PI INDICATOR 8 (GLOVE) ×2
GLOVE BIOGEL PI INDICATOR 8.5 (GLOVE) ×2
GLOVE ECLIPSE 7.5 STRL STRAW (GLOVE) ×3 IMPLANT
GLOVE ECLIPSE 8.5 STRL (GLOVE) ×3 IMPLANT
GLOVE EXAM NITRILE XL STR (GLOVE) IMPLANT
GLOVE SURG SS PI 7.5 STRL IVOR (GLOVE) ×3 IMPLANT
GOWN STRL REUS W/ TWL LRG LVL3 (GOWN DISPOSABLE) ×2 IMPLANT
GOWN STRL REUS W/ TWL XL LVL3 (GOWN DISPOSABLE) IMPLANT
GOWN STRL REUS W/TWL 2XL LVL3 (GOWN DISPOSABLE) ×3 IMPLANT
GOWN STRL REUS W/TWL LRG LVL3 (GOWN DISPOSABLE) ×6
GOWN STRL REUS W/TWL XL LVL3 (GOWN DISPOSABLE)
HEMOSTAT POWDER KIT SURGIFOAM (HEMOSTASIS) ×3 IMPLANT
KIT BASIN OR (CUSTOM PROCEDURE TRAY) ×3 IMPLANT
KIT DILATOR XLIF 5 (KITS) ×2 IMPLANT
KIT SURGICAL ACCESS MAXCESS 4 (KITS) ×3 IMPLANT
KIT TURNOVER KIT B (KITS) ×3 IMPLANT
KIT XLIF (KITS) ×1
MODULE NVM5 NEXT GEN EMG (NEEDLE) ×3 IMPLANT
NEEDLE HYPO 25X1 1.5 SAFETY (NEEDLE) ×3 IMPLANT
NS IRRIG 1000ML POUR BTL (IV SOLUTION) ×3 IMPLANT
PACK LAMINECTOMY NEURO (CUSTOM PROCEDURE TRAY) ×3 IMPLANT
PLATE DECADE XLIF 4HOLE SZ14 (Plate) ×3 IMPLANT
SCREW DECADE 5.5X50 (Screw) ×6 IMPLANT
SPONGE LAP 4X18 RFD (DISPOSABLE) IMPLANT
SUT VIC AB 2-0 CP2 18 (SUTURE) ×3 IMPLANT
SUT VIC AB 3-0 SH 8-18 (SUTURE) ×3 IMPLANT
TAPE CLOTH 4X10 WHT NS (GAUZE/BANDAGES/DRESSINGS) ×6 IMPLANT
TOWEL GREEN STERILE (TOWEL DISPOSABLE) ×3 IMPLANT
TOWEL GREEN STERILE FF (TOWEL DISPOSABLE) ×3 IMPLANT
TRAY FOLEY MTR SLVR 16FR STAT (SET/KITS/TRAYS/PACK) ×3 IMPLANT
WATER STERILE IRR 1000ML POUR (IV SOLUTION) ×3 IMPLANT

## 2019-08-19 NOTE — Anesthesia Postprocedure Evaluation (Signed)
Anesthesia Post Note  Patient: Jeremy Garcia  Procedure(s) Performed: Lumbar three- Anterolateral lumbar interbody fusion with lateral plate fixation (N/A )     Patient location during evaluation: PACU Anesthesia Type: General Level of consciousness: awake and alert Pain management: pain level controlled Vital Signs Assessment: post-procedure vital signs reviewed and stable Respiratory status: spontaneous breathing, nonlabored ventilation, respiratory function stable and patient connected to nasal cannula oxygen Cardiovascular status: blood pressure returned to baseline and stable Postop Assessment: no apparent nausea or vomiting Anesthetic complications: no    Last Vitals:  Vitals:   08/19/19 1151 08/19/19 1219  BP: 135/71 124/68  Pulse: 78 75  Resp: (!) 9 16  Temp: 36.6 C 36.5 C  SpO2: 96% 97%    Last Pain:  Vitals:   08/19/19 1250  TempSrc:   PainSc: 7                  Adrick Kestler L Orlinda Slomski

## 2019-08-19 NOTE — Discharge Summary (Signed)
Physician Discharge Summary  Patient ID: Jeremy Garcia MRN: [id number] DOB/AGE: November 13, 1955 64 y.o.  Admit date: 08/19/2019 Discharge date: 08/19/2019  Admission Diagnoses: L3-L4 spondylolisthesis with lumbar radiculopathy, neurogenic claudication  Discharge Diagnoses: L3-L4 spondylolisthesis with lumbar radiculopathy, lumbar stenosis, neurogenic claudication. Active Problems:   Spondylolisthesis of lumbar region   Discharged Condition: good  Hospital Course: Patient was admitted to undergo surgical decompression arthrodesis L3-4.  Tolerated surgery well  Consults: None  Significant Diagnostic Studies: None  Treatments: surgery: Anterolateral decompression L3-L4 arthrodesis with XLIF spacer allograft arthrodesis  Discharge Exam: Blood pressure 124/68, pulse 75, temperature 97.7 F (36.5 C), temperature source Oral, resp. rate 16, height 5\' 10"  (1.778 m), weight 81.6 kg, SpO2 97 %. Incision is clean and dry Station and gait are intact motor strength is intact.   Disposition: Discharge disposition: 01-Home or Self Care       Discharge Instructions    Call MD for:  redness, tenderness, or signs of infection (pain, swelling, redness, odor or green/yellow discharge around incision site)   Complete by: As directed    Call MD for:  severe uncontrolled pain   Complete by: As directed    Call MD for:  temperature >100.4   Complete by: As directed    Diet - low sodium heart healthy   Complete by: As directed    Discharge instructions   Complete by: As directed    Okay to shower. Do not apply salves or appointments to incision. No heavy lifting with the upper extremities greater than 15 pounds. May resume driving when not requiring pain medication and patient feels comfortable with doing so.   Incentive spirometry RT   Complete by: As directed    Increase activity slowly   Complete by: As directed      Allergies as of 08/19/2019   No Known Allergies     Medication List     TAKE these medications   celecoxib 200 MG capsule Commonly known as: CELEBREX Take 200 mg by mouth 2 (two) times daily as needed (pain.).   diazepam 5 MG tablet Commonly known as: Valium Take 1 tablet (5 mg total) by mouth every 6 (six) hours as needed for muscle spasms.   loratadine 10 MG tablet Commonly known as: CLARITIN Take 10 mg by mouth daily.   methocarbamol 500 MG tablet Commonly known as: ROBAXIN Take 500 mg by mouth every 6 (six) hours as needed for spasms.   oxyCODONE-acetaminophen 5-325 MG tablet Commonly known as: PERCOCET/ROXICET Take 1-2 tablets by mouth every 4 (four) hours as needed for moderate pain or severe pain.   tadalafil 10 MG tablet Commonly known as: Cialis Take 1 tablet (10 mg total) by mouth daily as needed for erectile dysfunction.        Signed: 08/21/2019 Jeremy Garcia 08/19/2019, 3:52 PM

## 2019-08-19 NOTE — Plan of Care (Signed)
Pt doing well. Pt and wife given D/C instructions with verbal understanding. Rx's were sent to the pharmacy my MD. Pt's incisions are clean and dry with no sign of infection. Pt's IV was removed prior to D/C. Pt D/C'd home via wheelchair per MD order. Pt is stable @ D/C and has no other needs at this time. Rema Fendt, RN

## 2019-08-19 NOTE — Transfer of Care (Signed)
Immediate Anesthesia Transfer of Care Note  Patient: Jeremy Garcia  Procedure(s) Performed: Lumbar three- Anterolateral lumbar interbody fusion with lateral plate fixation (N/A )  Patient Location: PACU  Anesthesia Type:General  Level of Consciousness: sedated  Airway & Oxygen Therapy: Patient Spontanous Breathing, Patient connected to face mask oxygen and oral airway  Post-op Assessment: Report given to RN and Post -op Vital signs reviewed and stable  Post vital signs: Reviewed and stable  Last Vitals:  Vitals Value Taken Time  BP 133/78 08/19/19 1106  Temp    Pulse 80 08/19/19 1106  Resp 11 08/19/19 1106  SpO2 100 % 08/19/19 1106  Vitals shown include unvalidated device data.  Last Pain:  Vitals:   08/19/19 0625  TempSrc: Oral  PainSc:       Patients Stated Pain Goal: 3 (08/19/19 8343)  Complications: No apparent anesthesia complications

## 2019-08-19 NOTE — Evaluation (Signed)
Physical Therapy Evaluation Patient Details Name: Jeremy Garcia MRN: [id number] DOB: 1956/04/03 Today's Date: 08/19/2019   History of Present Illness  Patient is a 64 y/o male admitted with L3-L4 spondylolisthesis with lumbar radiculopathy, neurogenic claudication now s/p Anterolateral decompression L3-L4 arthrodesis with XLIF spacer allograft arthrodesis  Clinical Impression  Patient presents with mobility at S level for safety and keeping back precautions.  Wife and pt able to demonstrate safe technique and verbalize understanding of education.  No follow up PT needed initially.  Will sign off as planned d/c home today.     Follow Up Recommendations No PT follow up    Equipment Recommendations  None recommended by PT    Recommendations for Other Services       Precautions / Restrictions Precautions Precautions: Back Precaution Booklet Issued: Yes (comment) Precaution Comments: reviewed precautions with pt and spouse Required Braces or Orthoses: Spinal Brace Spinal Brace: Lumbar corset;Applied in sitting position Restrictions Weight Bearing Restrictions: No      Mobility  Bed Mobility Overal bed mobility: Modified Independent             General bed mobility comments: cueing to reinforce technique, but pt performing well  Transfers Overall transfer level: Modified independent               General transfer comment: wide stance and UE support to stand  Ambulation/Gait Ambulation/Gait assistance: Supervision;Modified independent (Device/Increase time) Gait Distance (Feet): 250 Feet Assistive device: None Gait Pattern/deviations: Wide base of support;Decreased stride length;Shuffle;Step-through pattern     General Gait Details: decreased trunk rotation  Stairs Stairs: Yes Stairs assistance: Supervision Stair Management: One rail Right;Step to pattern;Forwards Number of Stairs: 10 General stair comments: cues for technique, S for safety, wife educated  to provide S initially at home and in [facility] technique on entry steps with no rail  Wheelchair Mobility    Modified Rankin (Stroke Patients Only)       Balance Overall balance assessment: Mild deficits observed, not formally tested                                           Pertinent Vitals/Pain Pain Assessment: 0-10 Pain Score: 4  Pain Location: L anterior hip Pain Descriptors / Indicators: Burning Pain Intervention(s): Monitored during session;Repositioned(encouraged ice)    Home Living Family/patient expects to be discharged to:: Private residence Living Arrangements: Spouse/significant other Available Help at Discharge: Family Type of Home: House Home Access: Stairs to enter Entrance Stairs-Rails: None Technical brewer of Steps: 3 Home Layout: Two level;Bed/bath upstairs Home Equipment: None      Prior Function Level of Independence: Independent               Hand Dominance        Extremity/Trunk Assessment   Upper Extremity Assessment Upper Extremity Assessment: Overall WFL for tasks assessed    Lower Extremity Assessment Lower Extremity Assessment: Overall WFL for tasks assessed    Cervical / Trunk Assessment Cervical / Trunk Assessment: Other exceptions Cervical / Trunk Exceptions: PPT and R lateral shift  Communication   Communication: No difficulties  Cognition Arousal/Alertness: Awake/alert Behavior During Therapy: WFL for tasks assessed/performed Overall Cognitive Status: Within Functional Limits for tasks assessed  General Comments General comments (skin integrity, edema, etc.): Discussed car transfers and information on handout regarding teeth brushing, keeping items frequently used out on counter and having wet wipes for toilet hygiene    Exercises     Assessment/Plan    PT Assessment    PT Problem List         PT Treatment Interventions      PT  Goals (Current goals can be found in the Care Plan section)  Acute Rehab PT Goals Patient Stated Goal: to go home today PT Goal Formulation: All assessment and education complete, DC therapy    Frequency     Barriers to discharge        Co-evaluation               AM-PAC PT "6 Clicks" Mobility  Outcome Measure Help needed turning from your back to your side while in a flat bed without using bedrails?: None Help needed moving from lying on your back to sitting on the side of a flat bed without using bedrails?: None Help needed moving to and from a bed to a chair (including a wheelchair)?: None Help needed standing up from a chair using your arms (e.g., wheelchair or bedside chair)?: None Help needed to walk in hospital room?: None Help needed climbing 3-5 steps with a railing? : None 6 Click Score: 24    End of Session Equipment Utilized During Treatment: Back brace Activity Tolerance: Patient tolerated treatment well Patient left: in bed;with call bell/phone within reach;with family/visitor present   PT Visit Diagnosis: Difficulty in walking, not elsewhere classified (R26.2)    Time: 0300-9233 PT Time Calculation (min) (ACUTE ONLY): 18 min   Charges:   PT Evaluation $PT Eval Low Complexity: 1 Low          Jeremy Garcia, [facility] [phone] 08/19/2019   Jeremy Garcia 08/19/2019, 4:43 PM

## 2019-08-19 NOTE — Anesthesia Preprocedure Evaluation (Addendum)
Anesthesia Evaluation  Patient identified by MRN, date of birth, ID band Patient awake    Reviewed: Allergy & Precautions, NPO status , Patient's Chart, lab work & pertinent test results  Airway Mallampati: II  TM Distance: >3 FB Neck ROM: Full    Dental no notable dental hx. (+) Teeth Intact, Dental Advisory Given   Pulmonary neg pulmonary ROS,    Pulmonary exam normal breath sounds clear to auscultation       Cardiovascular negative cardio ROS Normal cardiovascular exam Rhythm:Regular Rate:Normal     Neuro/Psych negative neurological ROS  negative psych ROS   GI/Hepatic Neg liver ROS, GERD  Controlled and Medicated,  Endo/Other  negative endocrine ROS  Renal/GU negative Renal ROS  negative genitourinary   Musculoskeletal negative musculoskeletal ROS (+)   Abdominal   Peds  Hematology negative hematology ROS (+)   Anesthesia Other Findings   Reproductive/Obstetrics                            Anesthesia Physical Anesthesia Plan  ASA: II  Anesthesia Plan: General   Post-op Pain Management:    Induction: Intravenous  PONV Risk Score and Plan: 2 and Midazolam, Dexamethasone and Ondansetron  Airway Management Planned: Oral ETT  Additional Equipment:   Intra-op Plan:   Post-operative Plan: Extubation in OR  Informed Consent: I have reviewed the patients History and Physical, chart, labs and discussed the procedure including the risks, benefits and alternatives for the proposed anesthesia with the patient or authorized representative who has indicated his/her understanding and acceptance.     Dental advisory given  Plan Discussed with: CRNA  Anesthesia Plan Comments:         Anesthesia Quick Evaluation

## 2019-08-19 NOTE — Anesthesia Procedure Notes (Addendum)
Procedure Name: Intubation Date/Time: 08/19/2019 8:16 AM Performed by: Leonor Liv, CRNA Pre-anesthesia Checklist: Patient identified, Emergency Drugs available, Suction available and Patient being monitored Patient Re-evaluated:Patient Re-evaluated prior to induction Oxygen Delivery Method: Circle System Utilized Preoxygenation: Pre-oxygenation with 100% oxygen Induction Type: IV induction Ventilation: Mask ventilation without difficulty Laryngoscope Size: Mac and 4 Grade View: Grade II Tube type: Oral Tube size: 7.5 mm Number of attempts: 1 Airway Equipment and Method: Stylet and Oral airway Placement Confirmation: ETT inserted through vocal cords under direct vision,  positive ETCO2 and breath sounds checked- equal and bilateral Secured at: 22 cm Tube secured with: Tape Dental Injury: Teeth and Oropharynx as per pre-operative assessment

## 2019-08-19 NOTE — Op Note (Addendum)
Date of surgery: 08/19/2019 Preoperative diagnosis: Spondylolisthesis L3-L4 with severe stenosis lumbar radiculopathy, neurogenic claudication. Postoperative diagnosis: Same Procedure: Anterolateral decompression L3-L4 with X-LIF spacer allograft arthrodesis lateral plate fixation.  Neuro monitoring with EMG and fluoroscopic guidance Surgeon: Barnett Abu First Assistant: Lisbeth Renshaw Anesthesia: General endotracheal Indications: Jeremy Garcia is a 64 year old individual who is had a spondylolisthesis at the L3-L4 level as some other levels of adjacent spondylosis but he has a high-grade stenosis at L3-L4 and has been developing lumbar radiculopathic symptoms in L4 distribution along with symptoms of neurogenic claudication when he would walk a greater distance.  He was advised regarding surgical decompression of the singular level and is now admitted for that procedure.  Procedure: Patient was brought to the operating room supine on a stretcher.  After the smooth induction of general tracheal anesthesia he was turned to the right lateral decubitus position and orthogonal positioning was checked radiographically when appropriate he was taped in the position across the pelvis and then across the chest.  The table break was used to extend the approach from the left side.  Then the entry site at L3-L4 was marked on the lateral aspect of the skin with fluoroscopic imaging that was checked for orthogonal allergy.  The skin was prepped with alcohol DuraPrep and draped in a sterile fashion and then 2 incisions were created 1 directly over the approach and the second 1 posteriorly off the midline.  The posterior incision was then dissected bluntly to enter the retroperitoneal space and feel the undersurface of the lowest rib in addition to the transverse processes and the psoas muscle.  With a finger in the space the second probe was placed through the lateral incision and was passed into the retroperitoneal  space.  The probe was then docked onto the interspace at L3-4 and a K wire was passed into it neuro monitoring was performed to make sure no elements of the lumbar plexus were nearby when verified a series of dilators were passed to the 15 mm size and 120 mm deep retractor was then used over this all the time monitoring to make sure that no elements of the lumbar plexus were involved.  Once placed the retractor was fixed to the operating table with a clamp and the inner cannulas were removed to allow exploration in the depth with the electrode probe this was done to make sure that there is no elements of the lumbar plexus and when verified a shim was placed into the disc space at the L3-L4 space.  With this the retractor was opened further to expose the entirety of the lateral aspect of the disc base at L3-4.  15 blade was used to open the lateral aspect of the disc and remove substantial quantities of significantly degenerated disc material from this region a series of disc shavers were then used to release the disc from the endplates and further discectomy was completed until a probe could be passed into the interspace and a series of dilators could then be passed to open up the disc space.  Ultimately was felt that a 22 mm long 14 mm tall spacer with 10 degrees lordosis would fit best into this interval.  The spacer was filled with osteocell allograft for a total volume of 10 cc.  This was then placed after adequate decortication of the endplates was obtained and the area directly behind the shim was decompressed by removing the shim and removing disc material from the region in the subligamentous space near  the disc at L3-4.  Once this was completed the spacer was tamped into place under fluoroscopic guidance using 2 slides to protect the endplates.  Once placed appropriately the slides were removed the spacer was checked for positioning some additional bone graft was packed ventral to the spacer and then a 14  mm tall 4-hole plate was placed on the lateral aspect of the vertebrae with 2 screws securing it to L3 and 2 screws securing it L4 the posterior screws were noted be 4.5 mm in diameter and 50 mm in length anterior screws were 5.0 mm in diameter and 50 mm in length.  Final radiographs were obtained in AP and lateral projection and showed good restoration of the height reduction of the spondylolisthesis and fixation overall was acceptable.  With this hemostasis in the soft tissues was obtained as the retractor was released and then removed soft tissues were then closed with 2-0 Vicryl in the fascia and 3-0 Vicryl subcuticularly blood loss for the entire procedure was estimated at less than 75 cc.  Patient was then returned to the recovery room in stable condition.

## 2019-08-19 NOTE — Progress Notes (Signed)
Orthopedic Tech Progress Note Patient Details:  Jeremy Garcia [birth year] [id number] Called in order to [facility] for a LSO. Patient ID: [name], male   DOB: 1955/08/10, 64 y.o.   MRN: [id number]   Donald Pore 08/19/2019, 9:04 AM

## 2019-08-19 NOTE — H&P (Signed)
CHIEF COMPLAINT: Low back pain, sciatica, worsening over the past 4 months.  HISTORY OF PRESENT ILLNESS: [name] is a 64 year old, ambidextrous individual who had developed some increasing back pain since about October of this past year.  He notes that in August he started a renovation project which required a fair amount of bending, lifting and twisting, and gradually had worsening back pain with radicular symptoms into both lower extremities.  He has developed some numbness into the lateral aspect of the right foot with a tingling sensation whenever he rubs along the outer aspect of the right foot.  His left leg feels weak, particularly when walking stairs.  He notes that this process is not improving and recently he has noted that he is standing crooked.  This has been in the last month or so.  He tells me that about 30 years ago he had surgery at L5-S1 and he notes that at that time, he recalls being at bed rest for nearly a month's time but gradually symptoms improved and he returned to full function.  He has been using some over-the-counter ibuprofen to help mitigate this pain, but he notes that he has been requiring increasing doses and things are not improving.  He is seen now for further evaluation of this process.     Today in the office we obtained an AP and lateral x-ray of the lumbar spine with flexion-extension views.  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  At the upper area of his spine on the left side I can see a transverse process of what I would call L1.  On the right side, there may be an articulation with a small rib instead of the transverse process.  Similarly, at the bottom end I note what I would call S1 on the left side has a transverse process, but on the right side it appears that it is fused to the sacrum.  If that is the case, then at what would traditionally be called L4-L5 there has been some surgical intervention with collapse  of the disc space.  However, what is also noted is that on the lateral radiographs, there is a spondylolisthesis at L2-3 and L3-4 above this joint that is mobile by about 2 or 3 mm between flexion and extension.  Also on the AP image, there is not a true scoliosis but a slight leaning over to the right side of the upper portion of the lumbar vertebrae.  There are some asymmetric degenerative changes noted greater on the right at L3-4 and on the left at L2-3 with some lateral bony spurring and there is some moderate collapse of the disc space below that.  That is symmetric.  PHYSICAL EXAMINATION: I note that [name] can stand with a slight forward stoop and a slight lean toward the right side.  He has difficulty standing completely straight and erect.  His motor function is good in the iliopsoas and the quad on the right, but on the left side, he demonstrates some weakness when he steps up onto a stool that is 7 inches off the ground.  His motor function distally in the tibialis anterior and gastrocs is intact and his gait is symmetric and normal.  PAST MEDICAL HISTORY: Reveals that his general health has been excellent.  MEDICATIONS: He reports that he takes no medication whatsoever.  ALLERGIES: He is not allergic to any medications.  Impression/Plan: Review of the results of the MRI that was performed on  February 17th.  The study demonstrates that there is a high-grade stenosis at the level of L3-L4.  Notably, [name] appears to have 6 lumbar-type vertebrae.  He also has some milder stenosis at L2-3 and some mild to moderate stenosis at L4-L5.  Nonetheless, L3-4 clearly is the symptomatic level with a high-grade stenosis and in talking to [name], his symptoms mostly are that of neurogenic claudication in addition to some radiculopathy with numbness down into the legs.  After reviewing his plain films, and the MRI, I have advised that he would be best served with a 1 level decompression done  via an anterolateral approach using an XLIF spacer at L3-L4 and lateral plate fixation stabilized at L3-4 level.  I noted that the surgery typically involves a simple overnight stay in the hospital.  Afterwards, he would require the use of an external corset for about 6-8 weeks time.  And generally, as patients recover, they notice that their strength and their stamina on their feet tends to improve.  The singular biggest issue is of course, concern for nerve impingement and the potential for nerve damage.  I noted that the surgery is done with neural monitoring throughout the case but invariably on the side of approach some people feel some anterior thigh pain that lasts for a period of about 2-4 weeks, sometimes a little longer from approaching through the psoas muscle.  Nonetheless, this is typically well controlled and reversible and overall I believe that [name] would do best with this minimally invasive approach as opposed to an open operation, which I also discussed with him done via posterior approach.  I do not believe that the other levels should be involved at this time as I believe that the stenosis at the level of L3-4 is severe enough to explain the fullness of his symptoms.  I do not feel also that further conservative efforts are likely to alleviate or lessen the severity of his high-grade stenosis at that level.  Therefore, I believe that the surgical consideration is appropriate at this time.

## 2019-08-20 ENCOUNTER — Encounter: Payer: Self-pay | Admitting: *Deleted

## 2019-08-24 NOTE — Addendum Note (Signed)
Addendum  created 08/24/19 0730 by Jodell Cipro, CRNA   Attestation recorded in [place], Intraprocedure Attestations filed

## 2019-09-09 ENCOUNTER — Other Ambulatory Visit: Payer: Self-pay

## 2019-09-09 ENCOUNTER — Encounter (HOSPITAL_COMMUNITY): Payer: Self-pay | Admitting: *Deleted

## 2019-09-09 ENCOUNTER — Emergency Department (HOSPITAL_COMMUNITY)
Admission: EM | Admit: 2019-09-09 | Discharge: 2019-09-09 | Disposition: A | Discharge status: Home / Self Care | Payer: BC Managed Care – PPO | Attending: Emergency Medicine | Admitting: Emergency Medicine

## 2019-09-09 ENCOUNTER — Ambulatory Visit
Admission: RE | Admit: 2019-09-09 | Discharge: 2019-09-09 | Disposition: A | Discharge status: Home / Self Care | Payer: BC Managed Care – PPO | Source: Ambulatory Visit | Attending: Neurological Surgery | Admitting: Neurological Surgery

## 2019-09-09 ENCOUNTER — Other Ambulatory Visit: Payer: Self-pay | Admitting: Neurological Surgery

## 2019-09-09 ENCOUNTER — Emergency Department (HOSPITAL_BASED_OUTPATIENT_CLINIC_OR_DEPARTMENT_OTHER): Payer: BC Managed Care – PPO

## 2019-09-09 DIAGNOSIS — I2699 Other pulmonary embolism without acute cor pulmonale: Secondary | ICD-10-CM

## 2019-09-09 DIAGNOSIS — Z79899 Other long term (current) drug therapy: Secondary | ICD-10-CM | POA: Diagnosis not present

## 2019-09-09 DIAGNOSIS — R079 Chest pain, unspecified: Secondary | ICD-10-CM | POA: Diagnosis present

## 2019-09-09 DIAGNOSIS — I82431 Acute embolism and thrombosis of right popliteal vein: Secondary | ICD-10-CM | POA: Diagnosis not present

## 2019-09-09 DIAGNOSIS — M7989 Other specified soft tissue disorders: Secondary | ICD-10-CM | POA: Diagnosis not present

## 2019-09-09 LAB — CBC
HCT: 42.4 % (ref 39.0–52.0)
Hemoglobin: 14.3 g/dL (ref 13.0–17.0)
MCH: 30.8 pg (ref 26.0–34.0)
MCHC: 33.7 g/dL (ref 30.0–36.0)
MCV: 91.4 fL (ref 80.0–100.0)
Platelets: 354 10*3/uL (ref 150–400)
RBC: 4.64 MIL/uL (ref 4.22–5.81)
RDW: 11.8 % (ref 11.5–15.5)
WBC: 13.4 10*3/uL — ABNORMAL HIGH (ref 4.0–10.5)
nRBC: 0 % (ref 0.0–0.2)

## 2019-09-09 LAB — BASIC METABOLIC PANEL
Anion gap: 10 (ref 5–15)
BUN: 14 mg/dL (ref 8–23)
CO2: 25 mmol/L (ref 22–32)
Calcium: 8.8 mg/dL — ABNORMAL LOW (ref 8.9–10.3)
Chloride: 101 mmol/L (ref 98–111)
Creatinine, Ser: 0.91 mg/dL (ref 0.61–1.24)
GFR calc Af Amer: >60 mL/min (ref >60)
GFR calc non Af Amer: >60 mL/min (ref >60)
Glucose, Bld: 135 mg/dL — ABNORMAL HIGH (ref 70–99)
Potassium: 4.1 mmol/L (ref 3.5–5.1)
Sodium: 136 mmol/L (ref 135–145)

## 2019-09-09 MED ORDER — SODIUM CHLORIDE 0.9% FLUSH: 3.0000 mL | Freq: Once | INTRAVENOUS | Status: DC

## 2019-09-09 MED ORDER — APIXABAN 5 MG PO TABS
10.0000 mg | ORAL_TABLET | Freq: Two times a day (BID) | ORAL | Status: DC
  Administered 2019-09-09: 10 mg via ORAL
  Filled 2019-09-09: qty 2

## 2019-09-09 MED ORDER — IOPAMIDOL (ISOVUE-370) INJECTION 76%
75.0000 mL | Freq: Once | INTRAVENOUS | Status: AC | PRN
  Administered 2019-09-09: 75 mL via INTRAVENOUS

## 2019-09-09 MED ORDER — APIXABAN 5 MG PO TABS: 5.0000 mg | ORAL_TABLET | Freq: Two times a day (BID) | ORAL | Status: DC

## 2019-09-09 MED ORDER — APIXABAN (ELIQUIS) VTE STARTER PACK (10MG AND 5MG): ORAL_TABLET | ORAL | 0 refills | Status: DC

## 2019-09-09 NOTE — Discharge Instructions (Addendum)
It appears that you have blood clots in the lungs, that started from a right leg deep venous thrombosis.  We are treating you with anticoagulant medication, Eliquis, to prevent progression of the clots.  You will likely continue to have some symptoms, which can be treated symptomatically.  Use Percocet or Tylenol, being careful to keep the Tylenol dose less than 2000 mg/day.  Try to elevate your legs, to help the swelling, and improve blood flow, without putting pressure behind your knees.  Sleep in the position of comfort.  Start the Eliquis prescription, which was sent to your pharmacy, tomorrow morning.

## 2019-09-09 NOTE — Progress Notes (Signed)
Lower venous duplex       has been completed. Preliminary results can be found under CV proc through chart review. Miho Monda, BS, RDMS, RVT   

## 2019-09-09 NOTE — ED Notes (Signed)
Patient given discharge instructions patient verbalizes understanding. 

## 2019-09-09 NOTE — ED Provider Notes (Signed)
[facility] EMERGENCY DEPARTMENT Provider Note   CSN: [id number] Arrival date & time: 09/09/19  1543     History Chief Complaint  Patient presents with  . PE    Jeremy Garcia is a 64 y.o. male.  HPI He presents for evaluation of known PE, discovered after outpatient CT angiogram chest reviewed bilateral PE.  Patient began having chest discomfort with shortness of breath, about 5 days ago.  He has pain when he takes a deep breath primarily in the left lower chest region.  He has been having fever treating it with Tylenol.  He has been able to eat or drink.  He has difficulty lying down because of the chest discomfort and trouble breathing.  He has been coughing, it is nonproductive.  He has been able to eat and drink.  He had lumbar fusion surgery done 3 weeks ago, and was proceeding well, until he developed the symptoms.  He feels that he has been walking as directed and trying to stay active.  No prior venous thromboembolism or PE.  No trauma other than surgery.  There are no other known modifying factors.   Past Medical History:  Diagnosis Date  . Anal fissure   . GERD (gastroesophageal reflux disease)   . Unspecified personal history presenting hazards to health     Patient Active Problem List   Diagnosis Date Noted  . Spondylolisthesis of lumbar region 08/19/2019  . Erectile dysfunction 12/01/2018  . Allergic rhinitis 12/01/2018    Past Surgical History:  Procedure Laterality Date  . ANTERIOR LAT LUMBAR FUSION N/A 08/19/2019   Procedure: Lumbar three- Anterolateral lumbar interbody fusion with lateral plate fixation;  Surgeon: Kristeen Miss, MD;  Location: [facility];  Service: Neurosurgery;  Laterality: N/A;  . BACK SURGERY     Due to sciatica  . KNEE CARTILAGE SURGERY     Arthroscopy       Family History  Problem Relation Age of Onset  . Hypertension Brother   . Prostate cancer Father        33s  . Coronary artery disease Mother     Social  History   Tobacco Use  . Smoking status: Never Smoker  . Smokeless tobacco: Never Used  Substance Use Topics  . Alcohol use: Yes    Alcohol/week: 1.0 standard drinks    Types: 1 Standard drinks or equivalent per week  . Drug use: Never    Home Medications Prior to Admission medications   Medication Sig Start Date End Date Taking? Authorizing Provider  diazepam (VALIUM) 5 MG tablet Take 1 tablet (5 mg total) by mouth every 6 (six) hours as needed for muscle spasms. 08/19/19  Yes Kristeen Miss, MD  loratadine (CLARITIN) 10 MG tablet Take 10 mg by mouth daily.   Yes [provider]  methocarbamol (ROBAXIN) 500 MG tablet Take 500 mg by mouth every 6 (six) hours as needed for spasms. 07/21/19  Yes [provider]  oxyCODONE-acetaminophen (PERCOCET/ROXICET) 5-325 MG tablet Take 1-2 tablets by mouth every 4 (four) hours as needed for moderate pain or severe pain. 08/19/19  Yes Kristeen Miss, MD  Apixaban Starter Pack (ELIQUIS STARTER PACK) 5 MG TBPK Take as directed on package: start with two-61m tablets twice daily for 7 days. On day 8, switch to one-562mtablet twice daily. 09/09/19   WeDaleen BoMD  tadalafil (CIALIS) 10 MG tablet Take 1 tablet (10 mg total) by mouth daily as needed for erectile dysfunction. Patient not taking:  Reported on 09/09/2019 12/01/18   Vivi Barrack, MD    Allergies    Patient has no known allergies.  Review of Systems   Review of Systems  All other systems reviewed and are negative.   Physical Exam Updated Vital Signs BP (!) 145/79   Pulse 91   Temp 98.6 F (37 C) (Oral)   Resp 15   SpO2 93%   Physical Exam Vitals and nursing note reviewed.  Constitutional:      General: He is not in acute distress.    Appearance: He is well-developed. He is not ill-appearing, toxic-appearing or diaphoretic.  HENT:     Head: Normocephalic and atraumatic.     Right Ear: External ear normal.     Left Ear: External ear normal.  Eyes:      Conjunctiva/sclera: Conjunctivae normal.     Pupils: Pupils are equal, round, and reactive to light.  Neck:     Trachea: Phonation normal.  Cardiovascular:     Rate and Rhythm: Normal rate and regular rhythm.     Heart sounds: Normal heart sounds. No murmur.  Pulmonary:     Effort: Pulmonary effort is normal. No respiratory distress.     Breath sounds: Normal breath sounds. No stridor. No wheezing or rhonchi.  Abdominal:     General: There is no distension.     Palpations: Abdomen is soft.     Tenderness: There is no abdominal tenderness.  Musculoskeletal:        General: Normal range of motion.     Cervical back: Normal range of motion and neck supple.     Comments: Mild bilateral lower leg swelling, left greater than right.  No popliteal mass or tenderness.  Intact distal perfusion both feet.  Skin:    General: Skin is warm and dry.  Neurological:     Mental Status: He is alert and oriented to person, place, and time.     Cranial Nerves: No cranial nerve deficit.     Sensory: No sensory deficit.     Motor: No abnormal muscle tone.     Coordination: Coordination normal.  Psychiatric:        Mood and Affect: Mood normal.        Behavior: Behavior normal.        Thought Content: Thought content normal.        Judgment: Judgment normal.     ED Results / Procedures / Treatments   Labs (all labs ordered are listed, but only abnormal results are displayed) Labs Reviewed  BASIC METABOLIC PANEL - Abnormal; Notable for the following components:      Result Value   Glucose, Bld 135 (*)    Calcium 8.8 (*)    All other components within normal limits  CBC - Abnormal; Notable for the following components:   WBC 13.4 (*)    All other components within normal limits    EKG EKG Interpretation  Date/Time:  Thursday September 09 2019 15:49:31 EDT Ventricular Rate:  99 PR Interval:  132 QRS Duration: 98 QT Interval:  362 QTC Calculation: 464 R Axis:   66 Text  Interpretation: Normal sinus rhythm Normal ECG No old tracing to compare Confirmed by Daleen Bo [phone]) on 09/09/2019 5:48:26 PM   Radiology CT ANGIO CHEST PE W OR WO CONTRAST  Result Date: 09/09/2019 CLINICAL DATA:  History of recent lumbar surgery with shortness of breath EXAM: CT ANGIOGRAPHY CHEST WITH CONTRAST TECHNIQUE: Multidetector CT imaging of the chest was  performed using the standard protocol during bolus administration of intravenous contrast. Multiplanar CT image reconstructions and MIPs were obtained to evaluate the vascular anatomy. CONTRAST:  60m ISOVUE-370 COMPARISON:  11/13/2010 FINDINGS: Cardiovascular: Thoracic aorta is well visualized and demonstrates a normal branching pattern. No aneurysmal dilatation or dissection is noted. No significant cardiac enlargement is seen. No coronary calcifications are noted. The pulmonary artery demonstrates a normal branching pattern. There are intraluminal filling defects identified bilaterally predominately within the lower lobes. No evidence of right heart strain is noted. Mediastinum/Nodes: Thoracic inlet is within normal limits. No sizable hilar or mediastinal adenopathy is noted. The esophagus is within normal limits. Lungs/Pleura: Lungs are well aerated bilaterally with the exception of bilateral lower lobe airspace opacity with small left pleural effusion. These changes are consistent with the underlying pulmonary emboli. No definitive pulmonary infarct is noted at this time. Upper Abdomen: Fatty infiltration of the liver is noted. Musculoskeletal: Degenerative changes of the thoracic spine are noted. Review of the MIP images confirms the above findings. IMPRESSION: Positive for acute PE with no CT evidence of right heart strain Bilateral lower lobe airspace opacities consistent with the underlying pulmonary emboli. Small left pleural effusion is noted. Electronically Signed   By: MInez CatalinaM.D.   On: 09/09/2019 14:46     Procedures .Critical Care Performed by: WDaleen Bo MD Authorized by: WDaleen Bo MD   Critical care provider statement:    Critical care time (minutes):  35   Critical care start time:  09/09/2019 4:30 PM   Critical care end time:  09/09/2019 6:44 PM   Critical care time was exclusive of:  Separately billable procedures and treating other patients   Critical care was necessary to treat or prevent imminent or life-threatening deterioration of the following conditions:  Respiratory failure   Critical care was time spent personally by me on the following activities:  Blood draw for specimens, development of treatment plan with patient or surrogate, discussions with consultants, evaluation of patient's response to treatment, examination of patient, obtaining history from patient or surrogate, ordering and performing treatments and interventions, ordering and review of laboratory studies, pulse oximetry, re-evaluation of patient's condition, review of old charts and ordering and review of radiographic studies   (including critical care time)  Medications Ordered in ED Medications  sodium chloride flush (NS) 0.9 % injection 3 mL (has no administration in time range)  apixaban (ELIQUIS) tablet 10 mg (10 mg Oral Given 09/09/19 1814)    Followed by  apixaban (ELIQUIS) tablet 5 mg (has no administration in time range)    ED Course  I have reviewed the triage vital signs and the nursing notes.  Pertinent labs & imaging results that were available during my care of the patient were reviewed by me and considered in my medical decision making (see chart for details).  Clinical Course as of Sep 09 1843  Thu Sep 09, 2019  1638 Per vascular technician, patient has a right leg DVT, popliteal, which is nonmobile.   [EW]  [id number] consulted for initiation of treatment with DOAC.  Patient counseled and agrees to proceed  VAS [place]OWER EXTREMITY VENOUS (DVT) (ONLY [facility] & [facility]) [EW]  1747 Normal  except mild elevation white blood cells  CBC(!) [EW]  1747 Normal except glucose high, calcium low  Basic metabolic panel(!) [EW]  [id number] Score= 73-- Class II   [EW]    Clinical Course User Index [EW] WDaleen Bo MD   MDM Rules/Calculators/A&P  Patient Vitals for the past 24 hrs:  BP Temp Temp src Pulse Resp SpO2  09/09/19 1835 (!) 145/79 -- -- 91 15 93 %  09/09/19 1800 132/77 -- -- 87 (!) 21 94 %  09/09/19 1745 140/75 -- -- 90 16 90 %  09/09/19 1730 (!) 150/70 -- -- 89 (!) 22 91 %  09/09/19 1715 (!) 160/90 -- -- 94 17 95 %  09/09/19 1700 (!) 141/71 -- -- 94 19 94 %  09/09/19 1645 (!) 142/88 -- -- 96 19 94 %  09/09/19 1644 (!) 142/88 98.6 F (37 C) Oral 96 18 93 %    6:40 PM Reevaluation with update and discussion. After initial assessment and treatment, an updated evaluation reveals he remains fairly comfortable very findings discussed with patient and wife, all questions answered. Daleen Bo   Medical Decision Making:  This patient is presenting for evaluation of known PE, discovered on imaging today as an outpatient, which does require a range of treatment options, and is a complaint that involves a moderate risk of morbidity and mortality. The differential diagnoses include infection, pulmonary infarction, venous thromboembolism. I decided  to review old records, and in summary he had surgery 3 weeks ago and has been keeping knees bent with a pillow behind them, to help the discomfort post surgery. I obtained additional historical information from his wife. Clinical Laboratory Tests Ordered, included CBC and B- met. I independently Visualized:  Cardiac Monitor Tracing which shows normal sinus rhythm.  Patient meets criteria for outpatient treatment, for venous trauma embolism with PE.  He is aware of the diagnosis and symptoms to watch for and follow-up care to receive.  Critical Interventions-initiation of anticoagulation  After These  Interventions, the Patient was reevaluated and was found to be stable and ready for discharge  CRITICAL CARE-yes Performed by: Daleen Bo  Nursing Notes Reviewed/ Care Coordinated Applicable Imaging Reviewed Interpretation of Laboratory Data incorporated into ED treatment  The patient appears reasonably screened and/or stabilized for discharge and I doubt any other medical condition or other [facility] requiring further screening, evaluation, or treatment in the ED at this time prior to discharge.  Plan: Home Medications-continue usual; Home Treatments-rest, elevate legs, continue light exercise; return here if the recommended treatment, does not improve the symptoms; Recommended follow up-neurosurgery, tomorrow as scheduled.  PCP in a week or so for arrangement of follow-up care.  Final Clinical Impression(s) / ED Diagnoses Final diagnoses:  Other acute pulmonary embolism without acute cor pulmonale (HCC)  Acute deep vein thrombosis (DVT) of popliteal vein of right lower extremity ([place])    Rx / DC Orders ED Discharge Orders         Ordered    Apixaban Starter Pack (ELIQUIS STARTER PACK) 5 MG TBPK     09/09/19 1837           Daleen Bo, MD 09/09/19 1845

## 2019-09-09 NOTE — ED Triage Notes (Signed)
To ED for eval of dx PE. Pt was sent from [facility] imaging. Pt had a back fusion 3 weeks ago. Pain and sob started on Monday. Speaks in full complete sentences. Complains of sharp chest pain.

## 2019-09-14 ENCOUNTER — Other Ambulatory Visit: Payer: Self-pay

## 2019-09-14 ENCOUNTER — Ambulatory Visit: Payer: BC Managed Care – PPO | Admitting: Family Medicine

## 2019-09-14 ENCOUNTER — Encounter: Payer: Self-pay | Admitting: Family Medicine

## 2019-09-14 VITALS — BP 152/88 | HR 84 | Temp 98.1°F | Ht 70.0 in | Wt 180.0 lb

## 2019-09-14 DIAGNOSIS — I824Y9 Acute embolism and thrombosis of unspecified deep veins of unspecified proximal lower extremity: Secondary | ICD-10-CM | POA: Diagnosis not present

## 2019-09-14 DIAGNOSIS — R61 Generalized hyperhidrosis: Secondary | ICD-10-CM | POA: Diagnosis not present

## 2019-09-14 DIAGNOSIS — I2699 Other pulmonary embolism without acute cor pulmonale: Secondary | ICD-10-CM | POA: Diagnosis not present

## 2019-09-14 DIAGNOSIS — Z86711 Personal history of pulmonary embolism: Secondary | ICD-10-CM | POA: Insufficient documentation

## 2019-09-14 MED ORDER — APIXABAN 5 MG PO TABS: 5.0000 mg | ORAL_TABLET | Freq: Two times a day (BID) | ORAL | 0 refills | Status: DC

## 2019-09-14 NOTE — Assessment & Plan Note (Signed)
Doing better on Eliquis.  His PE was provoked he has not had any other EKG in the past.  We will continue anticoagulation for a total of 3 months and then reassess.  Discussed warning signs and reasons to return to care.

## 2019-09-14 NOTE — Assessment & Plan Note (Signed)
Continue Eliquis.  We will continue for least 3 months and then reassess with lower extremity US.

## 2019-09-14 NOTE — Progress Notes (Signed)
Jeremy Garcia is a 64 y.o. male who presents today for an office visit.  Assessment/Plan:  New/Acute Problems: Night Sweats Likely stress reaction due to recent PE.  We will continue with watchful waiting.  If persist over the next few weeks will need to recheck labs including CBC, C met, TSH.  Chronic Problems Addressed Today: Deep vein thrombosis (DVT) of proximal lower extremity (HCC) Continue Eliquis.  We will continue for least 3 months and then reassess with lower extremity US.   Pulmonary embolism ([place]) Doing better on Eliquis.  His PE was provoked he has not had any other EKG in the past.  We will continue anticoagulation for a total of 3 months and then reassess.  Discussed warning signs and reasons to return to care.     Subjective:  HPI:  Patient here for follow-up.  He was seen in the ED 5 days ago after undergoing a outpatient CT angiogram which showed bilateral pulmonary embolism.  About 2 to 3 weeks prior to developing symptoms he had underwent lumbar fusion surgery.  While in the emergency room was found to also have right leg DVT.  He was started on Eliquis and discharged home.  He has done well since being home.  Still has some cough and shortness of breath that is stable.  Is also having occasional night sweats.         Objective:  Physical Exam: BP (!) 152/88   Pulse 84   Temp 98.1 F (36.7 C)   Ht 5' 10" (1.778 m)   Wt 180 lb (81.6 kg)   SpO2 97%   BMI 25.83 kg/m   Gen: No acute distress, resting comfortably CV: Regular rate and rhythm with no murmurs appreciated Pulm: Normal work of breathing, clear to auscultation bilaterally with no crackles, wheezes, or rhonchi Neuro: Grossly normal, moves all extremities Psych: Normal affect and thought content  Time Spent: 44 minutes of total time was spent on the date of the encounter performing the following actions: chart review prior to seeing the patient, obtaining history, performing a medically  necessary exam, counseling on the treatment plan, placing orders, and documenting in our EHR.        Algis Greenhouse. Jerline Pain, MD 09/14/2019 1:34 PM

## 2019-09-14 NOTE — Patient Instructions (Signed)
It was very nice to see you today!  You should be on the blood thinner for 3 months total.  We will send in a new prescription for Eliquis for 1 tablet twice daily.  You should start this on Thursday and continue through July 8.  We will check an ultrasound of your legs in about 3 months to make sure the blood clot has completely resolved.  You probably have excess cortisol due to your recent stress.  Please let me know if your night sweats worsen or do not improve over the next 2 weeks.  Take care, Dr [name]  Please try these tips to maintain a healthy lifestyle:   Eat at least 3 REAL meals and 1-2 snacks per day.  Aim for no more than 5 hours between eating.  If you eat breakfast, please do so within one hour of getting up.    Each meal should contain half fruits/vegetables, one quarter protein, and one quarter carbs (no bigger than a computer mouse)   Cut down on sweet beverages. This includes juice, soda, and sweet tea.     Drink at least 1 glass of water with each meal and aim for at least 8 glasses per day   Exercise at least 150 minutes every week.

## 2019-09-20 ENCOUNTER — Telehealth: Payer: Self-pay | Admitting: *Deleted

## 2019-09-20 ENCOUNTER — Telehealth (INDEPENDENT_AMBULATORY_CARE_PROVIDER_SITE_OTHER): Payer: BC Managed Care – PPO | Admitting: Family Medicine

## 2019-09-20 ENCOUNTER — Encounter: Payer: Self-pay | Admitting: Family Medicine

## 2019-09-20 DIAGNOSIS — I2699 Other pulmonary embolism without acute cor pulmonale: Secondary | ICD-10-CM | POA: Diagnosis not present

## 2019-09-20 MED ORDER — BENZONATATE 200 MG PO CAPS: 200.0000 mg | ORAL_CAPSULE | Freq: Two times a day (BID) | ORAL | 0 refills | Status: DC | PRN

## 2019-09-20 MED ORDER — GUAIFENESIN-CODEINE 100-10 MG/5ML PO SOLN: 5.0000 mL | Freq: Three times a day (TID) | ORAL | 0 refills | Status: DC | PRN

## 2019-09-20 NOTE — Telephone Encounter (Signed)
Pt appointment schedule

## 2019-09-20 NOTE — Telephone Encounter (Signed)
Please ask pt to schedule virtual visit ASAP.  If he is having significant bleeding, he needs to go back to the ED.  Katina Degree. [name], MD 09/20/2019 9:05 AM

## 2019-09-20 NOTE — Telephone Encounter (Signed)
Call Patient, notified Dr [name] received his picture.continue to watch for another 1-2 weeks, let us know if it worsen  Pt verbalized understanding

## 2019-09-20 NOTE — Telephone Encounter (Signed)
Pt call stating been having cough productive with little bit of blood, coughing lasting about 30 min. Episode happen about 4-8 apart. Pain on diaphragma are due to cough.  Pt want to know if he needs appointment or Rx send to pharmacy. Please advice

## 2019-09-20 NOTE — Progress Notes (Signed)
   Jeremy Garcia is a 64 y.o. male who presents today for a virtual office visit.  Assessment/Plan:  New/Acute Problems: Hemoptysis Appears to be very mild based on patient history.  He will send [place] a picture via MyChart of his sputum.  Hemoptysis likely secondary to DOAC in setting of PE and pulmonary infarct.  Does not have any respiratory distress and seems to be low volume of bleeding-do not think we need to pursue urgent work-up at this point.  We will treat his cough with Tessalon and guaifenesin-codeine.  If not improving over the next 1 to 2 weeks or if worsens in any way, have low threshold to rescan or send to pulmonology.  Discussed warning signs and reasons to seek emergent care including shortness of breath, or large-volume hemoptysis.  Chronic Problems Addressed Today: Pulmonary embolism (HCC) Overall stable though is having some mild hemoptysis.  This is likely due to side effect of his Eliquis and underlying PE/pulmonary infarct.  We will continue Eliquis for now given that hemoptysis seems to be very mild.  We will recheck CTA in 3 months before coming off anticoagulation.     Subjective:  HPI:  Patient here a virtual visit for cough and hemoptysis.  He was diagnosed with a PE about 2 weeks ago and started on DOAC.  And doing well until developing coughing fits and blood in the sputum over the last few days.  He is not coughing up any pure blood.  No nosebleeds.  Symptoms are worse at night and when lying down.  Will have coughing fits that last about 30 to 40 minutes at a time.  Has had some associated rib pain due to coughing.  No weakness or numbness.  No lightheadedness.  No fevers or chills.       Objective/Observations  Physical Exam: Gen: NAD, resting comfortably Pulm: Normal work of breathing Neuro: Grossly normal, moves all extremities Psych: Normal affect and thought content  Virtual Visit via Video   I connected with Jeremy Garcia on 09/20/19 at  3:40  PM EDT by a video enabled telemedicine application and verified that I am speaking with the correct person using two identifiers. The limitations of evaluation and management by telemedicine and the availability of in person appointments were discussed. The patient expressed understanding and agreed to proceed.   Patient location: Home Provider location: [place] [facility] Persons participating in the virtual visit: Myself and Patient     Katina Degree. [name], MD 09/20/2019 9:37 AM

## 2019-09-20 NOTE — Assessment & Plan Note (Signed)
Overall stable though is having some mild hemoptysis.  This is likely due to side effect of his Eliquis and underlying PE/pulmonary infarct.  We will continue Eliquis for now given that hemoptysis seems to be very mild.  We will recheck CTA in 3 months before coming off anticoagulation.

## 2019-09-23 ENCOUNTER — Encounter: Payer: Self-pay | Admitting: Family Medicine

## 2019-09-23 NOTE — Telephone Encounter (Signed)
Notified forms will be completed 2- 5 business days

## 2019-09-28 ENCOUNTER — Encounter: Payer: Self-pay | Admitting: Family Medicine

## 2019-09-28 ENCOUNTER — Other Ambulatory Visit: Payer: Self-pay | Admitting: Family Medicine

## 2019-09-28 MED ORDER — BENZONATATE 200 MG PO CAPS: 200.0000 mg | ORAL_CAPSULE | Freq: Two times a day (BID) | ORAL | 0 refills | Status: DC | PRN

## 2019-09-28 MED ORDER — GUAIFENESIN-CODEINE 100-10 MG/5ML PO SOLN: 5.0000 mL | Freq: Three times a day (TID) | ORAL | 0 refills | Status: DC | PRN

## 2019-09-28 NOTE — Telephone Encounter (Signed)
Patient called in to check on this medication.  I have informed patient that medication has been sent to pharmacy and to let Dr. [name] know if symptoms are not improving within 1-2 weeks.    Patient understood.

## 2019-09-28 NOTE — Telephone Encounter (Signed)
Rx sent in. Would like for patient to let us know if symptoms not improving over the next 1-2 weeks.  Katina Degree. [name], MD 09/28/2019 3:36 PM

## 2019-10-11 ENCOUNTER — Encounter: Payer: Self-pay | Admitting: Family Medicine

## 2019-10-12 NOTE — Telephone Encounter (Signed)
Please advise 

## 2019-10-12 NOTE — Telephone Encounter (Signed)
Called patient at [phone]  Pt notified ok to use the 40mg  dose. Stated do not need refills at this time

## 2019-12-02 ENCOUNTER — Encounter: Payer: Self-pay | Admitting: Family Medicine

## 2019-12-08 ENCOUNTER — Other Ambulatory Visit: Payer: Self-pay

## 2019-12-08 ENCOUNTER — Encounter: Payer: Self-pay | Admitting: Family Medicine

## 2019-12-08 ENCOUNTER — Ambulatory Visit (INDEPENDENT_AMBULATORY_CARE_PROVIDER_SITE_OTHER): Payer: BC Managed Care – PPO | Admitting: Family Medicine

## 2019-12-08 VITALS — BP 124/76 | HR 75 | Temp 97.6°F | Ht 70.0 in | Wt 182.2 lb

## 2019-12-08 DIAGNOSIS — R251 Tremor, unspecified: Secondary | ICD-10-CM | POA: Insufficient documentation

## 2019-12-08 DIAGNOSIS — I824Y9 Acute embolism and thrombosis of unspecified deep veins of unspecified proximal lower extremity: Secondary | ICD-10-CM

## 2019-12-08 DIAGNOSIS — Z6826 Body mass index (BMI) 26.0-26.9, adult: Secondary | ICD-10-CM

## 2019-12-08 DIAGNOSIS — Z125 Encounter for screening for malignant neoplasm of prostate: Secondary | ICD-10-CM

## 2019-12-08 DIAGNOSIS — J309 Allergic rhinitis, unspecified: Secondary | ICD-10-CM | POA: Diagnosis not present

## 2019-12-08 DIAGNOSIS — K219 Gastro-esophageal reflux disease without esophagitis: Secondary | ICD-10-CM | POA: Diagnosis not present

## 2019-12-08 DIAGNOSIS — Z13220 Encounter for screening for lipoid disorders: Secondary | ICD-10-CM | POA: Diagnosis not present

## 2019-12-08 DIAGNOSIS — I2699 Other pulmonary embolism without acute cor pulmonale: Secondary | ICD-10-CM | POA: Diagnosis not present

## 2019-12-08 DIAGNOSIS — Z0001 Encounter for general adult medical examination with abnormal findings: Secondary | ICD-10-CM | POA: Diagnosis not present

## 2019-12-08 DIAGNOSIS — E663 Overweight: Secondary | ICD-10-CM

## 2019-12-08 LAB — PSA: PSA: 0.61 ng/mL (ref 0.10–4.00)

## 2019-12-08 LAB — COMPREHENSIVE METABOLIC PANEL
ALT: 20 U/L (ref 0–53)
AST: 22 U/L (ref 0–37)
Albumin: 4.5 g/dL (ref 3.5–5.2)
Alkaline Phosphatase: 93 U/L (ref 39–117)
BUN: 16 mg/dL (ref 6–23)
CO2: 28 mEq/L (ref 19–32)
Calcium: 9 mg/dL (ref 8.4–10.5)
Chloride: 103 mEq/L (ref 96–112)
Creatinine, Ser: 0.83 mg/dL (ref 0.40–1.50)
GFR: 93.24 mL/min (ref >60.00)
Glucose, Bld: 92 mg/dL (ref 70–99)
Potassium: 4.3 mEq/L (ref 3.5–5.1)
Sodium: 139 mEq/L (ref 135–145)
Total Bilirubin: 0.8 mg/dL (ref 0.2–1.2)
Total Protein: 6.3 g/dL (ref 6.0–8.3)

## 2019-12-08 LAB — CBC
HCT: 47.7 % (ref 39.0–52.0)
Hemoglobin: 16.2 g/dL (ref 13.0–17.0)
MCHC: 34.1 g/dL (ref 30.0–36.0)
MCV: 88.9 fl (ref 78.0–100.0)
Platelets: 201 10*3/uL (ref 150.0–400.0)
RBC: 5.36 Mil/uL (ref 4.22–5.81)
RDW: 14.1 % (ref 11.5–15.5)
WBC: 5.6 10*3/uL (ref 4.0–10.5)

## 2019-12-08 LAB — LIPID PANEL
Cholesterol: 179 mg/dL (ref 0–200)
HDL: 50.3 mg/dL
LDL Cholesterol: 104 mg/dL — ABNORMAL HIGH (ref 0–99)
NonHDL: 128.68
Total CHOL/HDL Ratio: 4
Triglycerides: 121 mg/dL (ref 0.0–149.0)
VLDL: 24.2 mg/dL (ref 0.0–40.0)

## 2019-12-08 LAB — TSH: TSH: 1.24 u[IU]/mL (ref 0.35–4.50)

## 2019-12-08 MED ORDER — PANTOPRAZOLE SODIUM 40 MG PO TBEC: 40.0000 mg | DELAYED_RELEASE_TABLET | Freq: Every day | ORAL | 5 refills | Status: DC

## 2019-12-08 MED ORDER — AZELASTINE HCL 0.1 % NA SOLN: 2.0000 | Freq: Two times a day (BID) | NASAL | 12 refills | Status: DC

## 2019-12-08 NOTE — Assessment & Plan Note (Signed)
Likely benign essential tremor.  No other concerning signs or symptoms.  Discussed reasons to return care.

## 2019-12-08 NOTE — Assessment & Plan Note (Signed)
He has completed 3 months of anticoagulation.  We will recheck CTA per request.

## 2019-12-08 NOTE — Assessment & Plan Note (Signed)
Unclear if cough due to GERD or post nasal drip.  Will start Astelin as below.  If no improvement will start Protonix 40 mg daily.

## 2019-12-08 NOTE — Assessment & Plan Note (Signed)
Continue Claritin 10 mg daily.  His cough after eating could be due to postnasal drip.  Will start Astelin as well.

## 2019-12-08 NOTE — Assessment & Plan Note (Signed)
Patient has completed 3 months of anticoagulation.  We will recheck lower extremity Doppler per request for discontinuing anticoagulation.

## 2019-12-08 NOTE — Progress Notes (Signed)
Chief Complaint:  Jeremy Garcia is a 64 y.o. male who presents today for his annual comprehensive physical exam.    Assessment/Plan:  Chronic Problems Addressed Today: Tremor Likely benign essential tremor.  No other concerning signs or symptoms.  Discussed reasons to return care.   GERD (gastroesophageal reflux disease) Unclear if cough due to GERD or post nasal drip.  Will start Astelin as below.  If no improvement will start Protonix 40 mg daily.  Deep vein thrombosis (DVT) of proximal lower extremity (HCC) Patient has completed 3 months of anticoagulation.  We will recheck lower extremity Doppler per request for discontinuing anticoagulation.  Pulmonary embolism (HCC) He has completed 3 months of anticoagulation.  We will recheck CTA per request.  Allergic rhinitis Continue Claritin 10 mg daily.  His cough after eating could be due to postnasal drip.  Will start Astelin as well.  Body mass index is 26.15 kg/m. / Overweight  BMI Metric Follow Up - 12/08/19 1035      BMI Metric Follow Up-Please document annually   BMI Metric Follow Up Education provided            Preventative Healthcare: Check CBC, CMET, TSH, lipid panel.  Check PSA.  Up-to-date on other vaccines and screenings.  Patient Counseling(The following topics were reviewed and/or handout was given):  -Nutrition: Stressed importance of moderation in sodium/caffeine intake, saturated fat and cholesterol, caloric balance, sufficient intake of fresh fruits, vegetables, and fiber.  -Stressed the importance of regular exercise.   -Substance Abuse: Discussed cessation/primary prevention of tobacco, alcohol, or other drug use; driving or other dangerous activities under the influence; availability of treatment for abuse.   -Injury prevention: Discussed safety belts, safety helmets, smoke detector, smoking near bedding or upholstery.   -Sexuality: Discussed sexually transmitted diseases, partner selection, use of  condoms, avoidance of unintended pregnancy and contraceptive alternatives.   -Dental health: Discussed importance of regular tooth brushing, flossing, and dental visits.  -Health maintenance and immunizations reviewed. Please refer to Health maintenance section.  Return to care in 1 year for next preventative visit.     Subjective:  HPI:  He has no acute complaints today.   He has had more issues with coughing after eating.  He is concerned about possible GERD.  Is been going on for several years.  No obvious precipitating events.  Is also concerned about a left hand tremor has developed over the last few years.  Does not seem to be better or worse at any time of the day.  No obvious precipitating events.  No difficulty with walking or movement.  Lifestyle Diet: Balanced. Plenty of fruits and vegetables.  Exercise: Working out at home. Works on a stationary bike. Working with physical therapy.   Depression screen PHQ 2/9 12/01/2018  Decreased Interest 0  Down, Depressed, Hopeless 0  PHQ - 2 Score 0    Health Maintenance Due  Topic Date Due  . HIV Screening  Never done     ROS: Per HPI, otherwise a complete review of systems was negative.   PMH:  The following were reviewed and entered/updated in epic: Past Medical History:  Diagnosis Date  . Anal fissure   . GERD (gastroesophageal reflux disease)   . Unspecified personal history presenting hazards to health    Patient Active Problem List   Diagnosis Date Noted  . GERD (gastroesophageal reflux disease) 12/08/2019  . Tremor 12/08/2019  . Pulmonary embolism (HCC) 09/14/2019  . Deep vein thrombosis (DVT) of proximal  lower extremity (HCC) 09/14/2019  . Spondylolisthesis of lumbar region 08/19/2019  . Erectile dysfunction 12/01/2018  . Allergic rhinitis 12/01/2018   Past Surgical History:  Procedure Laterality Date  . ANTERIOR LAT LUMBAR FUSION N/A 08/19/2019   Procedure: Lumbar three- Anterolateral lumbar interbody  fusion with lateral plate fixation;  Surgeon: Barnett Abu, MD;  [facility];  Service: Neurosurgery;  Laterality: N/A;  . BACK SURGERY     Due to sciatica  . KNEE CARTILAGE SURGERY     Arthroscopy    Family History  Problem Relation Age of Onset  . Hypertension Brother   . Prostate cancer Father        106s  . Coronary artery disease Mother     Medications- reviewed and updated Current Outpatient Medications  Medication Sig Dispense Refill  . apixaban (ELIQUIS) 5 MG TABS tablet Take 1 tablet (5 mg total) by mouth 2 (two) times daily. 180 tablet 0  . loratadine (CLARITIN) 10 MG tablet Take 10 mg by mouth daily.    Marland Kitchen azelastine (ASTELIN) 0.1 % nasal spray Place 2 sprays into both nostrils 2 (two) times daily. 30 mL 12  . pantoprazole (PROTONIX) 40 MG tablet Take 1 tablet (40 mg total) by mouth daily. 30 tablet 5   No current facility-administered medications for this visit.    Allergies-reviewed and updated No Known Allergies  Social History   Socioeconomic History  . Marital status: Married    Spouse name: Not on file  . Number of children: Not on file  . Years of education: Not on file  . Highest education level: Not on file  Occupational History  . Occupation: Transport planner  Tobacco Use  . Smoking status: Never Smoker  . Smokeless tobacco: Never Used  Vaping Use  . Vaping Use: Never used  Substance and Sexual Activity  . Alcohol use: Yes    Alcohol/week: 1.0 standard drink    Types: 1 Standard drinks or equivalent per week  . Drug use: Never  . Sexual activity: Yes  Other Topics Concern  . Not on file  Social History Narrative  . Not on file   Social Determinants of Health   Financial Resource Strain:   . Difficulty of Paying Living Expenses:   Food Insecurity:   . Worried About Programme researcher, broadcasting/film/video in the Last Year:   . Barista in the Last Year:   Transportation Needs:   . Freight forwarder (Medical):   Marland Kitchen Lack of Transportation  (Non-Medical):   Physical Activity:   . Days of Exercise per Week:   . Minutes of Exercise per Session:   Stress:   . Feeling of Stress :   Social Connections:   . Frequency of Communication with Friends and Family:   . Frequency of Social Gatherings with Friends and Family:   . Attends Religious Services:   . Active Member of Clubs or Organizations:   . Attends Banker Meetings:   Marland Kitchen Marital Status:         Objective:  Physical Exam: BP 124/76   Pulse 75   Temp 97.6 F (36.4 C)   Ht 5\' 10"  (1.778 m)   Wt 182 lb 4 oz (82.7 kg)   SpO2 95%   BMI 26.15 kg/m   Body mass index is 26.15 kg/m. Wt Readings from Last 3 Encounters:  12/08/19 182 lb 4 oz (82.7 kg)  09/14/19 180 lb (81.6 kg)  08/19/19 180 lb (81.6 kg)  Gen: NAD, resting comfortably HEENT: TMs normal bilaterally. OP clear. No thyromegaly noted.  CV: RRR with no murmurs appreciated Pulm: NWOB, CTAB with no crackles, wheezes, or rhonchi GI: Normal bowel sounds present. Soft, Nontender, Nondistended. MSK: no edema, cyanosis, or clubbing noted Skin: warm, dry Neuro: CN2-12 grossly intact. Strength 5/5 in upper and lower extremities. Reflexes symmetric and intact bilaterally.  Psych: Normal affect and thought content     Klarissa Mcilvain M. [name], MD 12/08/2019 10:42 AM

## 2019-12-08 NOTE — Patient Instructions (Addendum)
It was very nice to see you today!  Please try taking the nasal spray to help with your cough after eating.  This does not work you can try taking the acid blocker medication.  I think you have a benign essential tremor in your hand.  This is not a cause for concern.  Please let me know if you have any worsening symptoms or if you develop any difficulty with movement or walking.  We will check blood work today.  We will get you set up to have a scan done on your leg and chest to make sure the clots have resolved.  I will see back in year for your next physical.  Please come back to see me sooner if needed.  Take care, Dr Jerline Pain  Please try these tips to maintain a healthy lifestyle:   Eat at least 3 REAL meals and 1-2 snacks per day.  Aim for no more than 5 hours between eating.  If you eat breakfast, please do so within one hour of getting up.    Each meal should contain half fruits/vegetables, one quarter protein, and one quarter carbs (no bigger than a computer mouse)   Cut down on sweet beverages. This includes juice, soda, and sweet tea.     Drink at least 1 glass of water with each meal and aim for at least 8 glasses per day   Exercise at least 150 minutes every week.     Preventive Care 13-5 Years Old, Male Preventive care refers to lifestyle choices and visits with your health care provider that can promote health and wellness. This includes:  A yearly physical exam. This is also called an annual well check.  Regular dental and eye exams.  Immunizations.  Screening for certain conditions.  Healthy lifestyle choices, such as eating a healthy diet, getting regular exercise, not using drugs or products that contain nicotine and tobacco, and limiting alcohol use. What can I expect for my preventive care visit? Physical exam Your health care provider will check:  Height and weight. These may be used to calculate body mass index (BMI), which is a measurement that  tells if you are at a healthy weight.  Heart rate and blood pressure.  Your skin for abnormal spots. Counseling Your health care provider may ask you questions about:  Alcohol, tobacco, and drug use.  Emotional well-being.  Home and relationship well-being.  Sexual activity.  Eating habits.  Work and work Statistician. What immunizations do I need?  Influenza (flu) vaccine  This is recommended every year. Tetanus, diphtheria, and pertussis (Tdap) vaccine  You may need a Td booster every 10 years. Varicella (chickenpox) vaccine  You may need this vaccine if you have not already been vaccinated. Zoster (shingles) vaccine  You may need this after age 62. Measles, mumps, and rubella (MMR) vaccine  You may need at least one dose of MMR if you were born in 1957 or later. You may also need a second dose. Pneumococcal conjugate (PCV13) vaccine  You may need this if you have certain conditions and were not previously vaccinated. Pneumococcal polysaccharide (PPSV23) vaccine  You may need one or two doses if you smoke cigarettes or if you have certain conditions. Meningococcal conjugate (MenACWY) vaccine  You may need this if you have certain conditions. Hepatitis A vaccine  You may need this if you have certain conditions or if you travel or work in places where you may be exposed to hepatitis A. Hepatitis B vaccine  You may need this if you have certain conditions or if you travel or work in places where you may be exposed to hepatitis B. Haemophilus influenzae type b (Hib) vaccine  You may need this if you have certain risk factors. Human papillomavirus (HPV) vaccine  If recommended by your health care provider, you may need three doses over 6 months. You may receive vaccines as individual doses or as more than one vaccine together in one shot (combination vaccines). Talk with your health care provider about the risks and benefits of combination vaccines. What tests  do I need? Blood tests  Lipid and cholesterol levels. These may be checked every 5 years, or more frequently if you are over 13 years old.  Hepatitis C test.  Hepatitis B test. Screening  Lung cancer screening. You may have this screening every year starting at age 37 if you have a 30-pack-year history of smoking and currently smoke or have quit within the past 15 years.  Prostate cancer screening. Recommendations will vary depending on your family history and other risks.  Colorectal cancer screening. All adults should have this screening starting at age 11 and continuing until age 63. Your health care provider may recommend screening at age 44 if you are at increased risk. You will have tests every 1-10 years, depending on your results and the type of screening test.  Diabetes screening. This is done by checking your blood sugar (glucose) after you have not eaten for a while (fasting). You may have this done every 1-3 years.  Sexually transmitted disease (STD) testing. Follow these instructions at home: Eating and drinking  Eat a diet that includes fresh fruits and vegetables, whole grains, lean protein, and low-fat dairy products.  Take vitamin and mineral supplements as recommended by your health care provider.  Do not drink alcohol if your health care provider tells you not to drink.  If you drink alcohol: ? Limit how much you have to 0-2 drinks a day. ? Be aware of how much alcohol is in your drink. In the [place] one drink equals one 12 oz bottle of beer (355 mL), one 5 oz glass of wine (148 mL), or one 1 oz glass of hard liquor (44 mL). Lifestyle  Take daily care of your teeth and gums.  Stay active. Exercise for at least 30 minutes on 5 or more days each week.  Do not use any products that contain nicotine or tobacco, such as cigarettes, e-cigarettes, and chewing tobacco. If you need help quitting, ask your health care provider.  If you are sexually active, practice  safe sex. Use a condom or other form of protection to prevent STIs (sexually transmitted infections).  Talk with your health care provider about taking a low-dose aspirin every day starting at age 17. What's next?  Go to your health care provider once a year for a well check visit.  Ask your health care provider how often you should have your eyes and teeth checked.  Stay up to date on all vaccines. This information is not intended to replace advice given to you by your health care provider. Make sure you discuss any questions you have with your health care provider. Document Revised: 05/14/2018 Document Reviewed: 05/14/2018 Elsevier Patient Education  Jeremy Garcia.   Essential Tremor A tremor is trembling or shaking that a person cannot control. Most tremors affect the hands or arms. Tremors can also affect the head, vocal cords, legs, and other parts of the body. Essential  tremor is a tremor without a known cause. Usually, it occurs while a person is trying to perform an action. It tends to get worse gradually as a person ages. What are the causes? The cause of this condition is not known. What increases the risk? You are more likely to develop this condition if:  You have a family member with essential tremor.  You are age 72 or older.  You take certain medicines. What are the signs or symptoms? The main sign of a tremor is a rhythmic shaking of certain parts of your body that is uncontrolled and unintentional. You may:  Have difficulty eating with a spoon or fork.  Have difficulty writing.  Nod your head up and down or side to side.  Have a quivering voice. The shaking may:  Get worse over time.  Come and go.  Be more noticeable on one side of your body.  Get worse due to stress, fatigue, caffeine, and extreme heat or cold. How is this diagnosed? This condition may be diagnosed based on:  Your symptoms and medical history.  A physical exam. There is no  single test to diagnose an essential tremor. However, your health care provider may order tests to rule out other causes of your condition. These may include:  Blood and urine tests.  Imaging studies of your brain, such as CT scan and MRI.  A test that measures involuntary muscle movement (electromyogram). How is this treated? Treatment for essential tremor depends on the severity of the condition.  Some tremors may go away without treatment.  Mild tremors may not need treatment if they do not affect your day-to-day life.  Severe tremors may need to be treated using one or more of the following options: ? Medicines. ? Lifestyle changes. ? Occupational or physical therapy. Follow these instructions at home: Lifestyle   Do not use any products that contain nicotine or tobacco, such as cigarettes and e-cigarettes. If you need help quitting, ask your health care provider.  Limit your caffeine intake as told by your health care provider.  Try to get 8 hours of sleep each night.  Find ways to manage your stress that fits your lifestyle and personality. Consider trying meditation or yoga.  Try to anticipate stressful situations and allow extra time to manage them.  If you are struggling emotionally with the effects of your tremor, consider working with a mental health provider. General instructions  Take over-the-counter and prescription medicines only as told by your health care provider.  Avoid extreme heat and extreme cold.  Keep all follow-up visits as told by your health care provider. This is important. Visits may include physical therapy visits. Contact a health care provider if:  You experience any changes in the location or intensity of your tremors.  You start having a tremor after starting a new medicine.  You have tremor with other symptoms, such as: ? Numbness. ? Tingling. ? Pain. ? Weakness.  Your tremor gets worse.  Your tremor interferes with your daily  life.  You feel down, blue, or sad for at least 2 weeks in a row.  Worrying about your tremor and what other people think about you interferes with your everyday life functions, including relationships, work, or school. Summary  Essential tremor is a tremor without a known cause. Usually, it occurs when you are trying to perform an action.  The cause of this condition is not known.  The main sign of a tremor is a rhythmic shaking  of certain parts of your body that is uncontrolled and unintentional.  Treatment for essential tremor depends on the severity of the condition. This information is not intended to replace advice given to you by your health care provider. Make sure you discuss any questions you have with your health care provider. Document Revised: 05/30/2017 Document Reviewed: 05/30/2017 Elsevier Patient Education  2020 [employer].

## 2019-12-09 ENCOUNTER — Encounter: Payer: Self-pay | Admitting: Family Medicine

## 2019-12-09 DIAGNOSIS — E785 Hyperlipidemia, unspecified: Secondary | ICD-10-CM | POA: Insufficient documentation

## 2019-12-09 NOTE — Progress Notes (Signed)
Please inform patient of the following:  His "bad" cholesterol is up just a bit but everything else is NORMAL. Do not need to make any changes to his treatment plan at this time. Would like for him to keep working on diet and exercise and we can recheck in a year.

## 2019-12-16 ENCOUNTER — Encounter: Payer: Self-pay | Admitting: Family Medicine

## 2019-12-20 ENCOUNTER — Other Ambulatory Visit: Payer: Self-pay

## 2019-12-20 DIAGNOSIS — I2699 Other pulmonary embolism without acute cor pulmonale: Secondary | ICD-10-CM

## 2019-12-20 NOTE — Telephone Encounter (Signed)
Sent referral to [facility] for [place]. Neysa Bonito will call pt to schedule appt.

## 2019-12-21 ENCOUNTER — Other Ambulatory Visit: Payer: Self-pay

## 2019-12-21 ENCOUNTER — Encounter (HOSPITAL_COMMUNITY): Payer: BC Managed Care – PPO

## 2019-12-21 ENCOUNTER — Ambulatory Visit (HOSPITAL_COMMUNITY)
Admission: RE | Admit: 2019-12-21 | Discharge: 2019-12-21 | Disposition: A | Discharge status: Home / Self Care | Payer: BC Managed Care – PPO | Source: Ambulatory Visit | Attending: Family Medicine | Admitting: Family Medicine

## 2019-12-21 DIAGNOSIS — I2699 Other pulmonary embolism without acute cor pulmonale: Secondary | ICD-10-CM | POA: Diagnosis present

## 2019-12-22 NOTE — Progress Notes (Signed)
Please inform patient of the following:  Good news! Ultrasound shows that he no longer has any blood clots in his legs. We will contact him with his CT scan results once we receive them.  Katina Degree. [name], MD 12/22/2019 7:59 AM

## 2019-12-23 ENCOUNTER — Ambulatory Visit
Admission: RE | Admit: 2019-12-23 | Discharge: 2019-12-23 | Disposition: A | Discharge status: Home / Self Care | Payer: BC Managed Care – PPO | Source: Ambulatory Visit | Attending: Family Medicine | Admitting: Family Medicine

## 2019-12-23 ENCOUNTER — Other Ambulatory Visit: Payer: Self-pay

## 2019-12-23 DIAGNOSIS — I2699 Other pulmonary embolism without acute cor pulmonale: Secondary | ICD-10-CM

## 2019-12-23 DIAGNOSIS — Z0001 Encounter for general adult medical examination with abnormal findings: Secondary | ICD-10-CM

## 2019-12-23 MED ORDER — IOPAMIDOL (ISOVUE-370) INJECTION 76%
75.0000 mL | Freq: Once | INTRAVENOUS | Status: AC | PRN
  Administered 2019-12-23: 75 mL via INTRAVENOUS

## 2019-12-23 NOTE — Progress Notes (Signed)
Please inform patient of the following:  CT scan showed that his blood clots have resolved - it is ok for him to stop taking the blood thinner.   The radiologist saw a small area of fullness in his lung that is likely a scar but they recommended he have a repeat CT scan in 3 months to make sure that it is stable. Please place future order for CT chest without contrast.  [name] M. [name], MD 12/23/2019 1:02 PM

## 2019-12-24 ENCOUNTER — Encounter: Payer: Self-pay | Admitting: Family Medicine

## 2019-12-24 ENCOUNTER — Other Ambulatory Visit: Payer: Self-pay | Admitting: Family Medicine

## 2019-12-30 ENCOUNTER — Encounter: Payer: Self-pay | Admitting: Family Medicine

## 2019-12-30 ENCOUNTER — Telehealth (INDEPENDENT_AMBULATORY_CARE_PROVIDER_SITE_OTHER): Payer: BC Managed Care – PPO | Admitting: Family Medicine

## 2019-12-30 ENCOUNTER — Other Ambulatory Visit: Payer: Self-pay

## 2019-12-30 DIAGNOSIS — R918 Other nonspecific abnormal finding of lung field: Secondary | ICD-10-CM | POA: Insufficient documentation

## 2019-12-30 DIAGNOSIS — I824Y9 Acute embolism and thrombosis of unspecified deep veins of unspecified proximal lower extremity: Secondary | ICD-10-CM

## 2019-12-30 DIAGNOSIS — I2699 Other pulmonary embolism without acute cor pulmonale: Secondary | ICD-10-CM

## 2019-12-30 NOTE — Assessment & Plan Note (Signed)
Recnet CT scan without any signs of PE.  He will stop Eliquis.

## 2019-12-30 NOTE — Assessment & Plan Note (Signed)
Found to have left lower lobe scarring on most recent CT scan.  Radiology recommended repeat CT scan in 3 months.  Had lengthy discussion regarding recent findings and reviewed his 2 most recent chest CTs and chest CT from about 10 years ago with patient.  Answered his questions.  He will schedule follow-up appointment in 3 months for CT scan.

## 2019-12-30 NOTE — Progress Notes (Signed)
   Jeremy Garcia is a 64 y.o. male who presents today for a virtual office visit.  Assessment/Plan:  Chronic Problems Addressed Today: Abnormal CT scan of lung Found to have left lower lobe scarring on most recent CT scan.  Radiology recommended repeat CT scan in 3 months.  Had lengthy discussion regarding recent findings and reviewed his 2 most recent chest CTs and chest CT from about 10 years ago with patient.  Answered his questions.  He will schedule follow-up appointment in 3 months for CT scan.  Deep vein thrombosis (DVT) of proximal lower extremity (HCC) Recent ultrasound without any signs of DVT.  He will stop Eliquis.  Pulmonary embolism (HCC) Recnet CT scan without any signs of PE.  He will stop Eliquis.     Subjective:  HPI:  See A/p.         Objective/Observations  Physical Exam: Gen: NAD, resting comfortably Pulm: Normal work of breathing Neuro: Grossly normal, moves all extremities Psych: Normal affect and thought content  Virtual Visit via Video   I connected with Jeremy Garcia on 12/30/19 at  2:40 PM EDT by a video enabled telemedicine application and verified that I am speaking with the correct person using two identifiers. The limitations of evaluation and management by telemedicine and the availability of in person appointments were discussed. The patient expressed understanding and agreed to proceed.   Patient location: Home Provider location: [place] [employer] Persons participating in the virtual visit: Myself and Patient     Katina Degree. [name], MD 12/30/2019 3:33 PM

## 2019-12-30 NOTE — Assessment & Plan Note (Signed)
Recent ultrasound without any signs of DVT.  He will stop Eliquis.

## 2020-01-05 ENCOUNTER — Encounter: Payer: Self-pay | Admitting: Family Medicine

## 2020-03-01 ENCOUNTER — Encounter: Payer: Self-pay | Admitting: Family Medicine

## 2020-03-01 ENCOUNTER — Other Ambulatory Visit: Payer: Self-pay | Admitting: *Deleted

## 2020-03-01 DIAGNOSIS — K219 Gastro-esophageal reflux disease without esophagitis: Secondary | ICD-10-CM

## 2020-03-02 NOTE — Telephone Encounter (Signed)
Please advise 

## 2020-03-03 ENCOUNTER — Other Ambulatory Visit: Payer: Self-pay | Admitting: *Deleted

## 2020-03-03 MED ORDER — PANTOPRAZOLE SODIUM 40 MG PO TBEC: 40.0000 mg | DELAYED_RELEASE_TABLET | Freq: Two times a day (BID) | ORAL | 1 refills | Status: DC

## 2020-03-14 ENCOUNTER — Other Ambulatory Visit: Payer: Self-pay

## 2020-03-14 ENCOUNTER — Ambulatory Visit (INDEPENDENT_AMBULATORY_CARE_PROVIDER_SITE_OTHER): Payer: BC Managed Care – PPO | Admitting: Family Medicine

## 2020-03-14 ENCOUNTER — Encounter: Payer: Self-pay | Admitting: Family Medicine

## 2020-03-14 VITALS — BP 139/74 | HR 90 | Temp 98.0°F | Ht 70.0 in | Wt 183.4 lb

## 2020-03-14 DIAGNOSIS — L0390 Cellulitis, unspecified: Secondary | ICD-10-CM

## 2020-03-14 MED ORDER — DOXYCYCLINE HYCLATE 100 MG PO TABS: 100.0000 mg | ORAL_TABLET | Freq: Two times a day (BID) | ORAL | 0 refills | Status: DC

## 2020-03-14 NOTE — Patient Instructions (Signed)
It was very nice to see you today!  You have a live infection around your wound.  Please start the doxycycline.  Please keep the wound clean, dry, and covered for the next several weeks.  It may take several weeks before your wound fully heals up.  If you notice any worsening pain or redness, please let me know.  Take care, Dr [name]  Wound Care, Adult Taking care of your wound properly can help to prevent pain, infection, and scarring. It can also help your wound to heal more quickly. How to care for your wound Wound care      Follow instructions from your health care provider about how to take care of your wound. Make sure you: ? Wash your hands with soap and water before you change the bandage (dressing). If soap and water are not available, use hand sanitizer. ? Change your dressing as told by your health care provider. ? Leave stitches (sutures), skin glue, or adhesive strips in place. These skin closures may need to stay in place for 2 weeks or longer. If adhesive strip edges start to loosen and curl up, you may trim the loose edges. Do not remove adhesive strips completely unless your health care provider tells you to do that.  Check your wound area every day for signs of infection. Check for: ? Redness, swelling, or pain. ? Fluid or blood. ? Warmth. ? Pus or a bad smell.  Ask your health care provider if you should clean the wound with mild soap and water. Doing this may include: ? Using a clean towel to pat the wound dry after cleaning it. Do not rub or scrub the wound. ? Applying a cream or ointment. Do this only as told by your health care provider. ? Covering the incision with a clean dressing.  Ask your health care provider when you can leave the wound uncovered.  Keep the dressing dry until your health care provider says it can be removed. Do not take baths, swim, use a hot tub, or do anything that would put the wound underwater until your health care provider  approves. Ask your health care provider if you can take showers. You may only be allowed to take sponge baths. Medicines   If you were prescribed an antibiotic medicine, cream, or ointment, take or use the antibiotic as told by your health care provider. Do not stop taking or using the antibiotic even if your condition improves.  Take over-the-counter and prescription medicines only as told by your health care provider. If you were prescribed pain medicine, take it 30 or more minutes before you do any wound care or as told by your health care provider. General instructions  Return to your normal activities as told by your health care provider. Ask your health care provider what activities are safe.  Do not scratch or pick at the wound.  Do not use any products that contain nicotine or tobacco, such as cigarettes and e-cigarettes. These may delay wound healing. If you need help quitting, ask your health care provider.  Keep all follow-up visits as told by your health care provider. This is important.  Eat a diet that includes protein, vitamin A, vitamin C, and other nutrient-rich foods to help the wound heal. ? Foods rich in protein include meat, dairy, beans, nuts, and other sources. ? Foods rich in vitamin A include carrots and dark green, leafy vegetables. ? Foods rich in vitamin C include citrus, tomatoes, and other fruits and  vegetables. ? Nutrient-rich foods have protein, carbohydrates, fat, vitamins, or minerals. Eat a variety of healthy foods including vegetables, fruits, and whole grains. Contact a health care provider if:  You received a tetanus shot and you have swelling, severe pain, redness, or bleeding at the injection site.  Your pain is not controlled with medicine.  You have redness, swelling, or pain around the wound.  You have fluid or blood coming from the wound.  Your wound feels warm to the touch.  You have pus or a bad smell coming from the wound.  You have a  fever or chills.  You are nauseous or you vomit.  You are dizzy. Get help right away if:  You have a red streak going away from your wound.  The edges of the wound open up and separate.  Your wound is bleeding, and the bleeding does not stop with gentle pressure.  You have a rash.  You faint.  You have trouble breathing. Summary  Always wash your hands with soap and water before changing your bandage (dressing).  To help with healing, eat foods that are rich in protein, vitamin A, vitamin C, and other nutrients.  Check your wound every day for signs of infection. Contact your health care provider if you suspect that your wound is infected. This information is not intended to replace advice given to you by your health care provider. Make sure you discuss any questions you have with your health care provider. Document Revised: 09/07/2018 Document Reviewed: 12/05/2015 Elsevier Patient Education  2020 [employer].

## 2020-03-14 NOTE — Progress Notes (Signed)
   Jeremy Garcia is a 64 y.o. male who presents today for an office visit.  Assessment/Plan:  New/Acute Problems: Cellulitis No red flags.  No signs of systemic illness.  Will start doxycycline 100 mg twice daily for 7 days.  Discussed wound care with patient.  He will keep area clean and dry and bandaged with antibiotic ointment until wound is able to heal via secondary intention.  Discussed with him that this process may take several weeks.  Discussed reasons to return to care.    Subjective:  HPI:  Skin tear a week ago. Fell on pavement while hiking.  Had immediate pain and bleeding to the area.  Over the last week he has been applying antiseptics there.  He is concerned that it does not seem to be healing.  Wife is concerned about possible infection.  He has noticed some reddened pain to the periphery of the wound.  He has noticed puslike material middle of the wound that he has been cleaning off daily as well.       Objective:  Physical Exam: BP 139/74   Pulse 90   Temp 98 F (36.7 C) (Temporal)   Ht 5\' 10"  (1.778 m)   Wt 183 lb 6.4 oz (83.2 kg)   SpO2 96%   BMI 26.32 kg/m   Gen: No acute distress, resting comfortably CV: Regular rate and rhythm with no murmurs appreciated Pulm: Normal work of breathing, clear to auscultation bilaterally with no crackles, wheezes, or rhonchi Skin: Approximately 2 cm crescent-shaped abrasion on left lateral knee with exposed subcutaneous tissue.  Surrounding erythema noted.  No purulent drainage. Neuro: Grossly normal, moves all extremities Psych: Normal affect and thought content      Rashod Gougeon M. , MD 03/14/2020 1:51 PM

## 2020-03-25 ENCOUNTER — Other Ambulatory Visit: Payer: Self-pay | Admitting: Family Medicine

## 2020-04-03 ENCOUNTER — Ambulatory Visit
Admission: RE | Admit: 2020-04-03 | Discharge: 2020-04-03 | Disposition: A | Discharge status: Home / Self Care | Payer: BC Managed Care – PPO | Source: Ambulatory Visit | Attending: Family Medicine | Admitting: Family Medicine

## 2020-04-03 ENCOUNTER — Other Ambulatory Visit: Payer: Self-pay

## 2020-04-03 DIAGNOSIS — I2699 Other pulmonary embolism without acute cor pulmonale: Secondary | ICD-10-CM

## 2020-04-04 NOTE — Progress Notes (Signed)
Please inform patient of the following:  CT scan showed stable left lower lobe scarring. Do not need to do any further testing.  Katina Degree. [name], MD 04/04/2020 2:42 PM

## 2020-05-03 IMAGING — CT CT ANGIO CHEST
2 of 8 series · 10 of 36 positions shown · IV contrast (agent unspecified)
Comparison: 11/13/2010

CLINICAL DATA: History of recent lumbar surgery with shortness of
breath

EXAM:
CT ANGIOGRAPHY CHEST WITH CONTRAST
TECHNIQUE: Multidetector CT imaging of the chest was performed using the
standard protocol during bolus administration of intravenous
contrast. Multiplanar CT image reconstructions and MIPs were
obtained to evaluate the vascular anatomy.
CONTRAST:  75mL [id number]

[Series 7: cta pulmonary 2.00 bv36 s3 · coronal · 0.62mm/px · 1 of 165 slices shown]
[im 83/165  mediastinal]
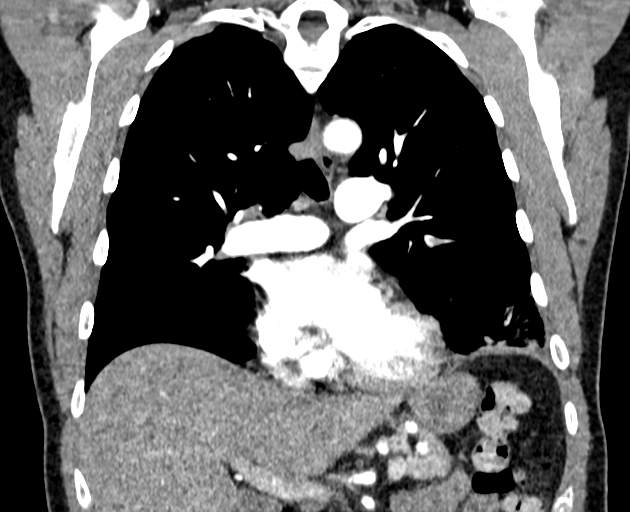

[Series 12: cta pulmonary 1.00 bv36 s3 super d. · axial · 0.77mm/px · z∈[+1592,+1847]mm · 9 of 398 slices shown]
[im 40/398  lung]
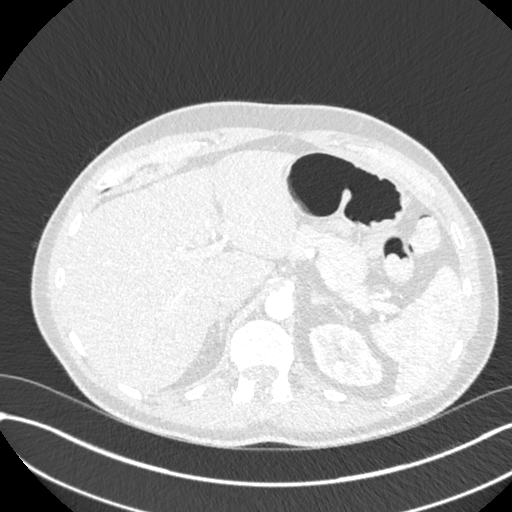
[im 80/398  mediastinal]
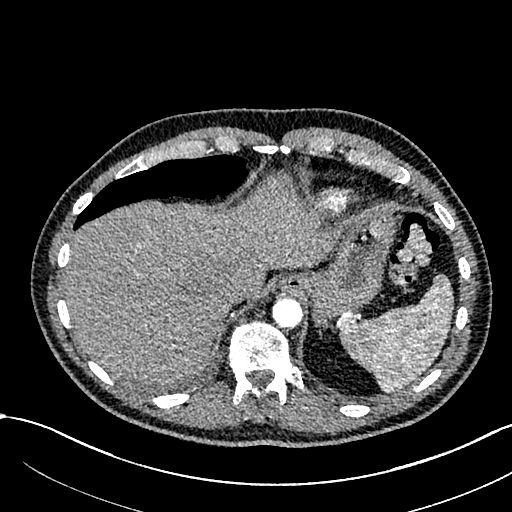
[im 120/398  lung]
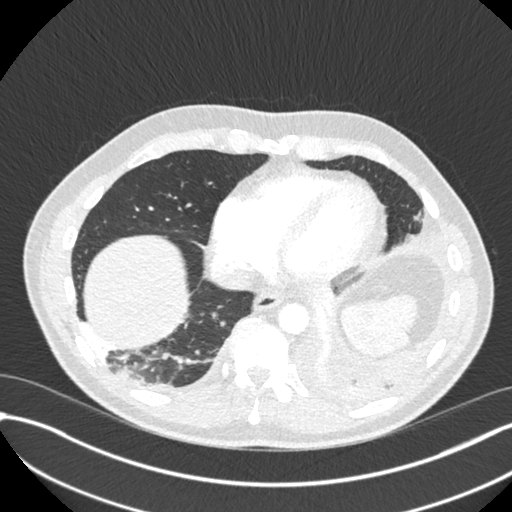
[im 159/398  mediastinal]
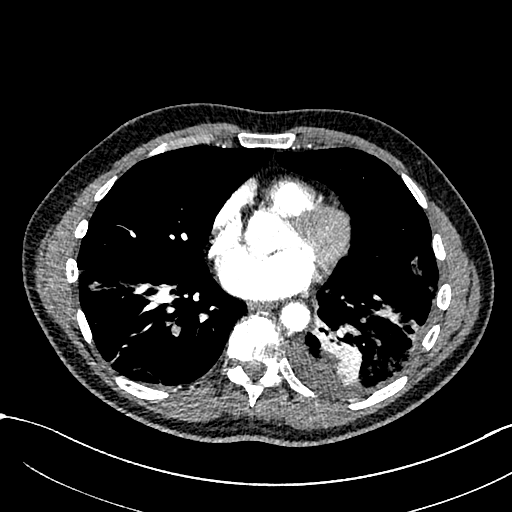
[im 199/398  lung]
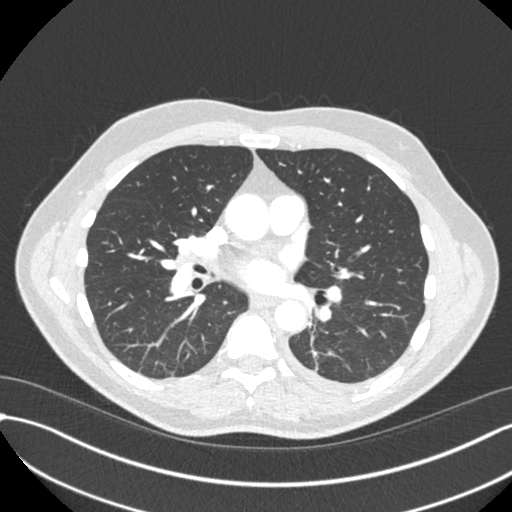
[im 239/398  mediastinal]
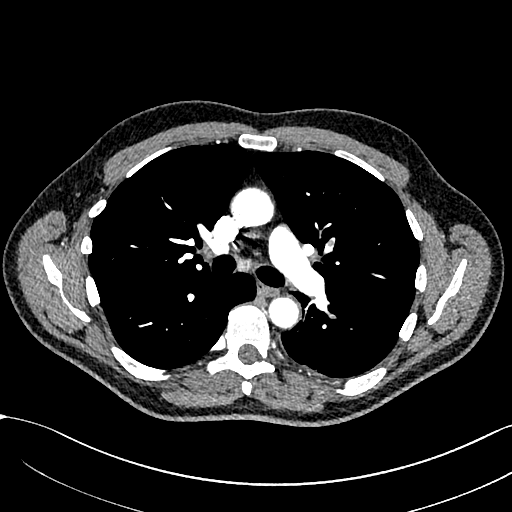
[im 278/398  lung]
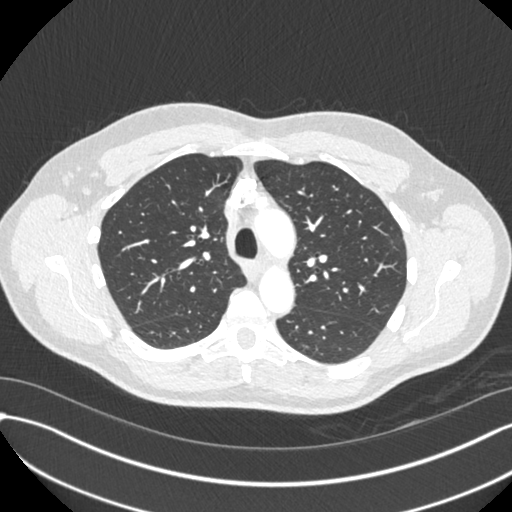
[im 318/398  mediastinal]
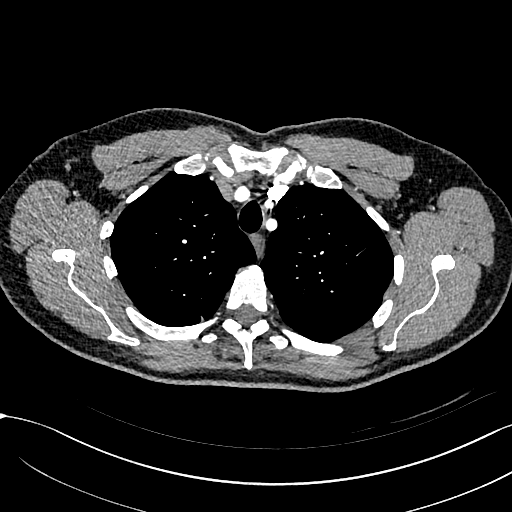
[im 358/398  lung]
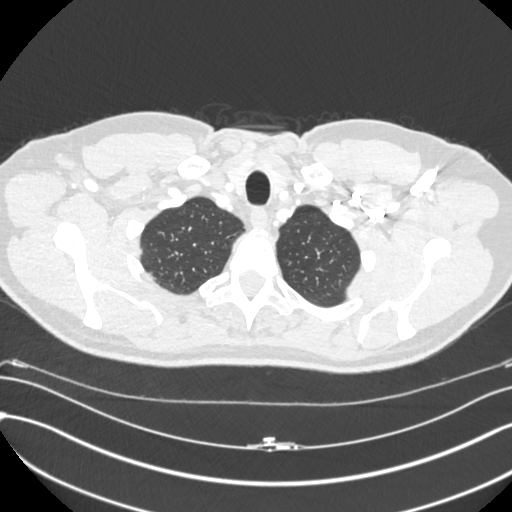

[10 of 36 positions shown; findings below may reference images not displayed]

FINDINGS: Cardiovascular: Thoracic aorta is well visualized and demonstrates a
normal branching pattern. No aneurysmal dilatation or dissection is
noted. No significant cardiac enlargement is seen. No coronary
calcifications are noted. The pulmonary artery demonstrates a normal
branching pattern. There are intraluminal filling defects identified
bilaterally predominately within the lower lobes. No evidence of
right heart strain is noted.

Mediastinum/Nodes: Thoracic inlet is within normal limits. No
sizable hilar or mediastinal adenopathy is noted. The esophagus is
within normal limits.

Lungs/Pleura: Lungs are well aerated bilaterally with the exception
of bilateral lower lobe airspace opacity with small left pleural
effusion. These changes are consistent with the underlying pulmonary
emboli. No definitive pulmonary infarct is noted at this time.

Upper Abdomen: Fatty infiltration of the liver is noted.

Musculoskeletal: Degenerative changes of the thoracic spine are
noted.

Review of the MIP images confirms the above findings.
IMPRESSION: Positive for acute PE with no CT evidence of right heart strain

Bilateral lower lobe airspace opacities consistent with the
underlying pulmonary emboli. Small left pleural effusion is noted.

## 2020-12-18 ENCOUNTER — Encounter: Payer: Self-pay | Admitting: Physician Assistant

## 2020-12-18 ENCOUNTER — Encounter: Payer: Self-pay | Admitting: Family Medicine

## 2020-12-18 ENCOUNTER — Telehealth: Payer: PPO | Admitting: Physician Assistant

## 2020-12-18 DIAGNOSIS — U071 COVID-19: Secondary | ICD-10-CM | POA: Diagnosis not present

## 2020-12-18 MED ORDER — BENZONATATE 100 MG PO CAPS
100.0000 mg | ORAL_CAPSULE | Freq: Three times a day (TID) | ORAL | 0 refills | Status: DC | PRN
Start: 2020-12-18 — End: 2021-01-23

## 2020-12-18 MED ORDER — MOLNUPIRAVIR EUA 200MG CAPSULE: 4.0000 | ORAL_CAPSULE | Freq: Two times a day (BID) | ORAL | 0 refills | Status: AC

## 2020-12-18 NOTE — Patient Instructions (Signed)
Hello Haeden,  You are being placed in the home monitoring program for COVID-19 (commonly known as Coronavirus).  This is because you are suspected to have the virus or are known to have the virus.  If you are unsure which group you fall into call your clinic.    As part of this program, you'll answer a daily questionnaire in the MyChart mobile app. You'll receive a notification through the MyChart app when the questionnaire is available. When you log in to MyChart, you'll see the tasks in your To Do activity.       Clinicians will see any answers that are concerning and take appropriate steps.  If at any point you are having a medical emergency, call 911.  If otherwise concerned call your clinic instead of coming into the clinic or hospital.  To keep from spreading the disease you should: Stay home and limit contact with other people as much as possible.  Wash your hands frequently. Cover your coughs and sneezes with a tissue, and throw used tissues in the trash.   Clean and disinfect frequently touched surfaces and objects.    Take care of yourself by: Staying home Resting Drinking fluids Take fever-reducing medications (Tylenol/Acetaminophen and Ibuprofen)  For more information on the disease go to the Centers for Disease Control and Prevention website     You are being prescribed MOLNUPIRAVIR for COVID-19 infection.   Please call the pharmacy or go through the drive through vs going inside if you are picking up the mediation yourself to prevent further spread. If prescribed to a [facility] affiliated pharmacy, a pharmacist will bring the medication out to your car.   ADMINISTRATION INSTRUCTIONS: Take with or without food. Swallow the tablets whole. Don't chew, crush, or break the medications because it might not work as well  For each dose of the medication, you should be taking FOUR tablets at one time, TWICE a day   Finish your full five-day course of Molnupiravir even if  you feel better before you're done. Stopping this medication too early can make it less effective to prevent severe illness related to COVID19.    Molnupiravir is prescribed for YOU ONLY. Don't share it with others, even if they have similar symptoms as you. This medication might not be right for everyone.   Make sure to take steps to protect yourself and others while you're taking this medication in order to get well soon and to prevent others from getting sick with COVID-19.   **If you are of childbearing potential (any gender) - it is advised to not get pregnant while taking this medication and recommended that condoms are used for male partners the next 3 months after taking the medication out of extreme caution    COMMON SIDE EFFECTS: Diarrhea Nausea  Dizziness    If your COVID-19 symptoms get worse, get medical help right away. Call 911 if you experience symptoms such as worsening cough, trouble breathing, chest pain that doesn't go away, confusion, a hard time staying awake, and pale or blue-colored skin. This medication won't prevent all COVID-19 cases from getting worse.  Can take to lessen severity: Vit C 500mg  twice daily Quercertin 250-500mg  twice daily Zinc 75-100mg  daily Melatonin 3-6 mg at bedtime Vit D3 1000-2000 IU daily Aspirin 81 mg daily with food Optional: Famotidine 20mg  daily Also can add tylenol/ibuprofen as needed for fevers and body aches May add Mucinex or Mucinex DM as needed for cough/congestion  10 Things You Can Do to  Manage Your COVID-19 Symptoms at Home If you have possible or confirmed COVID-19 Stay home except to get medical care. Monitor your symptoms carefully. If your symptoms get worse, call your healthcare provider immediately. Get rest and stay hydrated. If you have a medical appointment, call the healthcare provider ahead of time and tell them that you have or may have COVID-19. For medical emergencies, call 911 and notify the dispatch  personnel that you have or may have COVID-19. Cover your cough and sneezes with a tissue or use the inside of your elbow. Wash your hands often with soap and water for at least 20 seconds or clean your hands with an alcohol-based hand sanitizer that contains at least 60% alcohol. As much as possible, stay in a specific room and away from other people in your home. Also, you should use a separate bathroom, if available. If you need to be around other people in or outside of the home, wear a mask. Avoid sharing personal items with other people in your household, like dishes, towels, and bedding. Clean all surfaces that are touched often, like counters, tabletops, and doorknobs. Use household cleaning sprays or wipes according to the label instructions. [email or web address] 12/17/2019 This information is not intended to replace advice given to you by your health care provider. Make sure you discuss any questions you have with your healthcare provider. Document Revised: 07/07/2020 Document Reviewed: 07/07/2020 Elsevier Patient Education  [place].

## 2020-12-18 NOTE — Progress Notes (Signed)
Tessalon Mr. bunyan, brier are scheduled for a virtual visit with your provider today.    Just as we do with appointments in the office, we must obtain your consent to participate.  Your consent will be active for this visit and any virtual visit you may have with one of our providers in the next 365 days.    If you have a MyChart account, I can also send a copy of this consent to you electronically.  All virtual visits are billed to your insurance company just like a traditional visit in the office.  As this is a virtual visit, video technology does not allow for your provider to perform a traditional examination.  This may limit your provider's ability to fully assess your condition.  If your provider identifies any concerns that need to be evaluated in person or the need to arrange testing such as labs, EKG, etc, we will make arrangements to do so.    Although advances in technology are sophisticated, we cannot ensure that it will always work on either your end or our end.  If the connection with a video visit is poor, we may have to switch to a telephone visit.  With either a video or telephone visit, we are not always able to ensure that we have a secure connection.   I need to obtain your verbal consent now.   Are you willing to proceed with your visit today?   KAWON WILLCUTT has provided verbal consent on 12/18/2020 for a virtual visit (video or telephone).   Margaretann Loveless, PA-C 12/18/2020  11:45 AM  Virtual Visit Consent   [name], you are scheduled for a virtual visit with a [facility] provider today.     Just as with appointments in the office, your consent must be obtained to participate.  Your consent will be active for this visit and any virtual visit you may have with one of our providers in the next 365 days.     If you have a MyChart account, a copy of this consent can be sent to you electronically.  All virtual visits are billed to your insurance company just like a  traditional visit in the office.    As this is a virtual visit, video technology does not allow for your provider to perform a traditional examination.  This may limit your provider's ability to fully assess your condition.  If your provider identifies any concerns that need to be evaluated in person or the need to arrange testing (such as labs, EKG, etc.), we will make arrangements to do so.     Although advances in technology are sophisticated, we cannot ensure that it will always work on either your end or our end.  If the connection with a video visit is poor, the visit may have to be switched to a telephone visit.  With either a video or telephone visit, we are not always able to ensure that we have a secure connection.     I need to obtain your verbal consent now.   Are you willing to proceed with your visit today?    KHADEN GATER has provided verbal consent on 12/18/2020 for a virtual visit (video or telephone).   Margaretann Loveless, PA-C   Date: 12/18/2020 11:45 AM   Virtual Visit via Video Note   I, Margaretann Loveless, connected with  Jeremy Garcia  ([id number], 09-11-1955) on 12/18/20 at 11:30 AM EDT by a video-enabled telemedicine application  and verified that I am speaking with the correct person using two identifiers.  Location: Patient: Virtual Visit Location Patient: Home Provider: Virtual Visit Location Provider: Home Office   I discussed the limitations of evaluation and management by telemedicine and the availability of in person appointments. The patient expressed understanding and agreed to proceed.    History of Present Illness: Jeremy Garcia is a 65 y.o. who identifies as a male who was assigned male at birth, and is being seen today for covid 19, tested positive today, 12/18/20. Flew back from [place] on Thursday. Symptoms started yesterday with sinus congestion and scratchy throat.  HPI: URI  This is a new problem. The current episode started yesterday. The  problem has been unchanged. There has been no fever. Associated symptoms include congestion, coughing, headaches, rhinorrhea and sinus pain. Pertinent negatives include no diarrhea, ear pain, nausea, plugged ear sensation, sore throat (scratchy throat, not sore) or vomiting. Treatments tried: nyquil, dayquil. The treatment provided mild relief.     Problems:  Patient Active Problem List   Diagnosis Date Noted   Abnormal CT scan of lung 12/30/2019   Dyslipidemia 12/09/2019   GERD (gastroesophageal reflux disease) 12/08/2019   Tremor 12/08/2019   Pulmonary embolism (HCC) 09/14/2019   Deep vein thrombosis (DVT) of proximal lower extremity (HCC) 09/14/2019   Spondylolisthesis of lumbar region 08/19/2019   Erectile dysfunction 12/01/2018   Allergic rhinitis 12/01/2018    Allergies: No Known Allergies Medications:  Current Outpatient Medications:    benzonatate (TESSALON) 100 MG capsule, Take 1 capsule (100 mg total) by mouth 3 (three) times daily as needed., Disp: 30 capsule, Rfl: 0   molnupiravir EUA 200 mg CAPS, Take 4 capsules (800 mg total) by mouth 2 (two) times daily for 5 days., Disp: 40 capsule, Rfl: 0   doxycycline (VIBRA-TABS) 100 MG tablet, Take 1 tablet (100 mg total) by mouth 2 (two) times daily., Disp: 14 tablet, Rfl: 0   loratadine (CLARITIN) 10 MG tablet, Take 10 mg by mouth daily., Disp: , Rfl:   Observations/Objective: Patient is well-developed, well-nourished in no acute distress.  Resting comfortably at home.  Head is normocephalic, atraumatic.  No labored breathing.  Speech is clear and coherent with logical content.  Patient is alert and oriented at baseline.    Assessment and Plan: 1. COVID-19 - molnupiravir EUA 200 mg CAPS; Take 4 capsules (800 mg total) by mouth 2 (two) times daily for 5 days.  Dispense: 40 capsule; Refill: 0 - benzonatate (TESSALON) 100 MG capsule; Take 1 capsule (100 mg total) by mouth 3 (three) times daily as needed.  Dispense: 30 capsule;  Refill: 0 - MyChart COVID-19 home monitoring program; Future - Continue OTC symptomatic management of choice - Will send OTC vitamins and supplement information through AVS - Molnupiravir prescribed  - Patient enrolled in MyChart symptom monitoring - Push fluids - Rest as needed - Discussed return precautions and when to seek in-person evaluation, sent via AVS as well  Follow Up Instructions: I discussed the assessment and treatment plan with the patient. The patient was provided an opportunity to ask questions and all were answered. The patient agreed with the plan and demonstrated an understanding of the instructions.  A copy of instructions were sent to the patient via MyChart.  The patient was advised to call back or seek an in-person evaluation if the symptoms worsen or if the condition fails to improve as anticipated.  Time:  I spent 13 minutes with the patient via telehealth  technology discussing the above problems/concerns.    Margaretann Loveless, PA-C

## 2021-01-23 ENCOUNTER — Ambulatory Visit (INDEPENDENT_AMBULATORY_CARE_PROVIDER_SITE_OTHER): Payer: PPO | Admitting: Family Medicine

## 2021-01-23 ENCOUNTER — Encounter: Payer: Self-pay | Admitting: Family Medicine

## 2021-01-23 ENCOUNTER — Other Ambulatory Visit: Payer: Self-pay

## 2021-01-23 VITALS — BP 133/81 | HR 66 | Temp 98.0°F | Ht 70.0 in | Wt 182.8 lb

## 2021-01-23 DIAGNOSIS — Z23 Encounter for immunization: Secondary | ICD-10-CM

## 2021-01-23 DIAGNOSIS — Z125 Encounter for screening for malignant neoplasm of prostate: Secondary | ICD-10-CM

## 2021-01-23 DIAGNOSIS — E663 Overweight: Secondary | ICD-10-CM | POA: Diagnosis not present

## 2021-01-23 DIAGNOSIS — R739 Hyperglycemia, unspecified: Secondary | ICD-10-CM

## 2021-01-23 DIAGNOSIS — Z0001 Encounter for general adult medical examination with abnormal findings: Secondary | ICD-10-CM

## 2021-01-23 DIAGNOSIS — E785 Hyperlipidemia, unspecified: Secondary | ICD-10-CM

## 2021-01-23 DIAGNOSIS — K219 Gastro-esophageal reflux disease without esophagitis: Secondary | ICD-10-CM | POA: Diagnosis not present

## 2021-01-23 DIAGNOSIS — Z6826 Body mass index (BMI) 26.0-26.9, adult: Secondary | ICD-10-CM

## 2021-01-23 DIAGNOSIS — R251 Tremor, unspecified: Secondary | ICD-10-CM | POA: Diagnosis not present

## 2021-01-23 LAB — COMPREHENSIVE METABOLIC PANEL
ALT: 14 U/L (ref 0–53)
AST: 19 U/L (ref 0–37)
Albumin: 4.3 g/dL (ref 3.5–5.2)
Alkaline Phosphatase: 89 U/L (ref 39–117)
BUN: 14 mg/dL (ref 6–23)
CO2: 29 mEq/L (ref 19–32)
Calcium: 9 mg/dL (ref 8.4–10.5)
Chloride: 104 mEq/L (ref 96–112)
Creatinine, Ser: 0.96 mg/dL (ref 0.40–1.50)
GFR: 83.11 mL/min (ref >60.00)
Glucose, Bld: 92 mg/dL (ref 70–99)
Potassium: 4.6 mEq/L (ref 3.5–5.1)
Sodium: 140 mEq/L (ref 135–145)
Total Bilirubin: 0.6 mg/dL (ref 0.2–1.2)
Total Protein: 6.4 g/dL (ref 6.0–8.3)

## 2021-01-23 LAB — LIPID PANEL
Cholesterol: 162 mg/dL (ref 0–200)
HDL: 47 mg/dL (ref >39.00)
LDL Cholesterol: 94 mg/dL (ref 0–99)
NonHDL: 115.16
Total CHOL/HDL Ratio: 3
Triglycerides: 108 mg/dL (ref 0.0–149.0)
VLDL: 21.6 mg/dL (ref 0.0–40.0)

## 2021-01-23 LAB — CBC
HCT: 48.7 % (ref 39.0–52.0)
Hemoglobin: 16.4 g/dL (ref 13.0–17.0)
MCHC: 33.7 g/dL (ref 30.0–36.0)
MCV: 91.1 fl (ref 78.0–100.0)
Platelets: 208 10*3/uL (ref 150.0–400.0)
RBC: 5.34 Mil/uL (ref 4.22–5.81)
RDW: 13.3 % (ref 11.5–15.5)
WBC: 5.2 10*3/uL (ref 4.0–10.5)

## 2021-01-23 LAB — HEMOGLOBIN A1C: Hgb A1c MFr Bld: 5.6 % (ref 4.6–6.5)

## 2021-01-23 LAB — TSH: TSH: 1.2 u[IU]/mL (ref 0.35–5.50)

## 2021-01-23 LAB — PSA: PSA: 0.71 ng/mL (ref 0.10–4.00)

## 2021-01-23 MED ORDER — PROPRANOLOL HCL ER 60 MG PO CP24: 60.0000 mg | ORAL_CAPSULE | Freq: Every day | ORAL | 0 refills | Status: DC

## 2021-01-23 NOTE — Assessment & Plan Note (Signed)
Discussed treatment options with patient.  No other concerning signs or symptoms.  No other symptoms concerned with Parkinson Disease.  We will try propranolol 60 mg daily and he will check in with me in a couple weeks via MyChart.  May consider referral to neurology if symptoms progress or do not respond to treatment.

## 2021-01-23 NOTE — Assessment & Plan Note (Signed)
Check lipids 

## 2021-01-23 NOTE — Assessment & Plan Note (Signed)
Check A1c. 

## 2021-01-23 NOTE — Progress Notes (Signed)
Chief Complaint:  Jeremy Garcia is a 65 y.o. male who presents today for his annual comprehensive physical exam.    Assessment/Plan:  Chronic Problems Addressed Today: Hyperglycemia Check A1c.  Dyslipidemia Check lipids.  Tremor Discussed treatment options with patient.  No other concerning signs or symptoms.  No other symptoms concerned with Parkinson Disease.  We will try propranolol 60 mg daily and he will check in with me in a couple weeks via MyChart.  May consider referral to neurology if symptoms progress or do not respond to treatment.  GERD (gastroesophageal reflux disease) Follows with GI.  Had a recent flare.  Has been on Dexilant for about a month with mild improvement.  Advised him to continue for another couple of months and then follow-up with GI if still not improving.   Body mass index is 26.23 kg/m. / Overweight    Preventative Healthcare: Due for pneumonia vaccine. Will get blood work done today. Check labs.  Patient Counseling(The following topics were reviewed and/or handout was given):  -Nutrition: Stressed importance of moderation in sodium/caffeine intake, saturated fat and cholesterol, caloric balance, sufficient intake of fresh fruits, vegetables, and fiber.  -Stressed the importance of regular exercise.   -Substance Abuse: Discussed cessation/primary prevention of tobacco, alcohol, or other drug use; driving or other dangerous activities under the influence; availability of treatment for abuse.   -Injury prevention: Discussed safety belts, safety helmets, smoke detector, smoking near bedding or upholstery.   -Sexuality: Discussed sexually transmitted diseases, partner selection, use of condoms, avoidance of unintended pregnancy and contraceptive alternatives.   -Dental health: Discussed importance of regular tooth brushing, flossing, and dental visits.  -Health maintenance and immunizations reviewed. Please refer to Health maintenance  section.  Return to care in 1 year for next preventative visit.     Subjective:  HPI:  He has no acute complaints today.   He has had GERD issues in the past. His  gastroenterologist doctor started him on Dexilant. He state he took it for month and symptoms were improving. However, he started taking it again and only had mild relief.   In addition to this, he still have issue with  tremors.  Longstanding issue.  Symptoms are overall stable.  Worse when doing certain activities.  Worse when fatigued.  No other obvious aggravating or alleviating factors.  No rigidity noted.  Lifestyle Diet: Balanced Exercise: Walks.  Depression screen [facility] 2/9 01/23/2021  Decreased Interest 0  Down, Depressed, Hopeless 0  PHQ - 2 Score 0  Altered sleeping -  Tired, decreased energy -  Change in appetite -  Feeling bad or failure about yourself  -  Trouble concentrating -  Moving slowly or fidgety/restless -  Suicidal thoughts -  PHQ-9 Score -  Difficult doing work/chores -    Health Maintenance Due  Topic Date Due   HIV Screening  Never done   Zoster Vaccines- Shingrix (1 of 2) Never done   PNA vac Low Risk Adult (1 of 2 - PCV13) Never done   INFLUENZA VACCINE  01/01/2021     ROS: Per HPI, otherwise a complete review of systems was negative.   PMH:  The following were reviewed and entered/updated in epic: Past Medical History:  Diagnosis Date   Anal fissure    GERD (gastroesophageal reflux disease)    Unspecified personal history presenting hazards to health    Patient Active Problem List   Diagnosis Date Noted   Hyperglycemia 01/23/2021   Dyslipidemia 12/09/2019  GERD (gastroesophageal reflux disease) 12/08/2019   Tremor 12/08/2019   History of pulmonary embolism 09/14/2019   Spondylolisthesis of lumbar region 08/19/2019   Erectile dysfunction 12/01/2018   Allergic rhinitis 12/01/2018   Past Surgical History:  Procedure Laterality Date   ANTERIOR LAT LUMBAR FUSION N/A  08/19/2019   Procedure: Lumbar three- Anterolateral lumbar interbody fusion with lateral plate fixation;  Surgeon: Barnett Abu, MD;  Location: [facility] OR;  Service: Neurosurgery;  Laterality: N/A;   BACK SURGERY     Due to sciatica   KNEE CARTILAGE SURGERY     Arthroscopy    Family History  Problem Relation Age of Onset   Hypertension Brother    Prostate cancer Father        59s   Coronary artery disease Mother     Medications- reviewed and updated Current Outpatient Medications  Medication Sig Dispense Refill   dexlansoprazole (DEXILANT) 60 MG capsule Take 60 mg by mouth daily.     loratadine (CLARITIN) 10 MG tablet Take 10 mg by mouth daily.     propranolol ER (INDERAL LA) 60 MG 24 hr capsule Take 1 capsule (60 mg total) by mouth daily. 30 capsule 0   No current facility-administered medications for this visit.    Allergies-reviewed and updated No Known Allergies  Social History   Socioeconomic History   Marital status: Married    Spouse name: Not on file   Number of children: Not on file   Years of education: Not on file   Highest education level: Not on file  Occupational History   Occupation: Transport planner  Tobacco Use   Smoking status: Never   Smokeless tobacco: Never  Vaping Use   Vaping Use: Never used  Substance and Sexual Activity   Alcohol use: Yes    Alcohol/week: 1.0 standard drink    Types: 1 Standard drinks or equivalent per week   Drug use: Never   Sexual activity: Yes  Other Topics Concern   Not on file  Social History Narrative   Not on file   Social Determinants of Health   Financial Resource Strain: Not on file  Food Insecurity: Not on file  Transportation Needs: Not on file  Physical Activity: Not on file  Stress: Not on file  Social Connections: Not on file        Objective:  Physical Exam: BP 133/81   Pulse 66   Temp 98 F (36.7 C) (Temporal)   Ht 5\' 10"  (1.778 m)   Wt 182 lb 12.8 oz (82.9 kg)   SpO2 97%   BMI 26.23 kg/m    Body mass index is 26.23 kg/m. Wt Readings from Last 3 Encounters:  01/23/21 182 lb 12.8 oz (82.9 kg)  03/14/20 183 lb 6.4 oz (83.2 kg)  12/08/19 182 lb 4 oz (82.7 kg)   Gen: NAD, resting comfortably HEENT: TMs normal bilaterally. OP clear. No thyromegaly noted.  CV: RRR with no murmurs appreciated Pulm: NWOB, CTAB with no crackles, wheezes, or rhonchi GI: Normal bowel sounds present. Soft, Nontender, Nondistended. MSK: no edema, cyanosis, or clubbing noted Skin: warm, dry Neuro: CN2-12 grossly intact. Strength 5/5 in upper and lower extremities. Reflexes symmetric and intact bilaterally.  No rigidity.  Normal facial expression.  Normal gait. Psych: Normal affect and thought content     I,Savera Zaman,acting as a scribe for 02/08/20, MD.,have documented all relevant documentation on the behalf of Jacquiline Doe, MD,as directed by  Jacquiline Doe, MD while in the presence of  Jacquiline Doe, MD.  I, Jacquiline Doe, MD, have reviewed all documentation for this visit. The documentation on 01/23/21 for the exam, diagnosis, procedures, and orders are all accurate and complete.  Katina Degree. [name], MD 01/23/2021 9:46 AM

## 2021-01-23 NOTE — Assessment & Plan Note (Signed)
Follows with GI.  Had a recent flare.  Has been on Dexilant for about a month with mild improvement.  Advised him to continue for another couple of months and then follow-up with GI if still not improving.

## 2021-01-23 NOTE — Addendum Note (Signed)
Addended by: Dyann Kief on: 01/23/2021 09:52 AM   Modules accepted: Orders

## 2021-01-23 NOTE — Patient Instructions (Signed)
It was very nice to see you today!  Please start the propranolol 60 mg daily.  Please send a message in a few weeks to monitor how this is working for you.  Please continue working on diet and exercise.  We will give your pneumonia vaccine today.  We will check blood work today.  We will see back in year for your next physical.  Come back to see me sooner if needed.  Take care, Dr [name]  PLEASE NOTE:  If you had any lab tests please let us know if you have not heard back within a few days. You may see your results on mychart before we have a chance to review them but we will give you a call once they are reviewed by [place]. If we ordered any referrals today, please let us know if you have not heard from their office within the next week.   Please try these tips to maintain a healthy lifestyle:  Eat at least 3 REAL meals and 1-2 snacks per day.  Aim for no more than 5 hours between eating.  If you eat breakfast, please do so within one hour of getting up.   Each meal should contain half fruits/vegetables, one quarter protein, and one quarter carbs (no bigger than a computer mouse)  Cut down on sweet beverages. This includes juice, soda, and sweet tea.   Drink at least 1 glass of water with each meal and aim for at least 8 glasses per day  Exercise at least 150 minutes every week.    Preventive Care 65 Years and Older, Male Preventive care refers to lifestyle choices and visits with your health care provider that can promote health and wellness. This includes: A yearly physical exam. This is also called an annual wellness visit. Regular dental and eye exams. Immunizations. Screening for certain conditions. Healthy lifestyle choices, such as: Eating a healthy diet. Getting regular exercise. Not using drugs or products that contain nicotine and tobacco. Limiting alcohol use. What can I expect for my preventive care visit? Physical exam Your health care provider will check  your: Height and weight. These may be used to calculate your BMI (body mass index). BMI is a measurement that tells if you are at a healthy weight. Heart rate and blood pressure. Body temperature. Skin for abnormal spots. Counseling Your health care provider may ask you questions about your: Past medical problems. Family's medical history. Alcohol, tobacco, and drug use. Emotional well-being. Home life and relationship well-being. Sexual activity. Diet, exercise, and sleep habits. History of falls. Memory and ability to understand (cognition). Work and work Astronomer. Access to firearms. What immunizations do I need?  Vaccines are usually given at various ages, according to a schedule. Your health care provider will recommend vaccines for you based on your age, medicalhistory, and lifestyle or other factors, such as travel or where you work. What tests do I need? Blood tests Lipid and cholesterol levels. These may be checked every 5 years, or more often depending on your overall health. Hepatitis C test. Hepatitis B test. Screening Lung cancer screening. You may have this screening every year starting at age 27 if you have a 30-pack-year history of smoking and currently smoke or have quit within the past 15 years. Colorectal cancer screening. All adults should have this screening starting at age 13 and continuing until age 58. Your health care provider may recommend screening at age 60 if you are at increased risk. You will have tests every  1-10 years, depending on your results and the type of screening test. Prostate cancer screening. Recommendations will vary depending on your family history and other risks. Genital exam to check for testicular cancer or hernias. Diabetes screening. This is done by checking your blood sugar (glucose) after you have not eaten for a while (fasting). You may have this done every 1-3 years. Abdominal aortic aneurysm (AAA) screening. You may need  this if you are a current or former smoker. STD (sexually transmitted disease) testing, if you are at risk. Follow these instructions at home: Eating and drinking  Eat a diet that includes fresh fruits and vegetables, whole grains, lean protein, and low-fat dairy products. Limit your intake of foods with high amounts of sugar, saturated fats, and salt. Take vitamin and mineral supplements as recommended by your health care provider. Do not drink alcohol if your health care provider tells you not to drink. If you drink alcohol: Limit how much you have to 0-2 drinks a day. Be aware of how much alcohol is in your drink. In the [place] one drink equals one 12 oz bottle of beer (355 mL), one 5 oz glass of wine (148 mL), or one 1 oz glass of hard liquor (44 mL).  Lifestyle Take daily care of your teeth and gums. Brush your teeth every morning and night with fluoride toothpaste. Floss one time each day. Stay active. Exercise for at least 30 minutes 5 or more days each week. Do not use any products that contain nicotine or tobacco, such as cigarettes, e-cigarettes, and chewing tobacco. If you need help quitting, ask your health care provider. Do not use drugs. If you are sexually active, practice safe sex. Use a condom or other form of protection to prevent STIs (sexually transmitted infections). Talk with your health care provider about taking a low-dose aspirin or statin. Find healthy ways to cope with stress, such as: Meditation, yoga, or listening to music. Journaling. Talking to a trusted person. Spending time with friends and family. Safety Always wear your seat belt while driving or riding in a vehicle. Do not drive: If you have been drinking alcohol. Do not ride with someone who has been drinking. When you are tired or distracted. While texting. Wear a helmet and other protective equipment during sports activities. If you have firearms in your house, make sure you follow all gun safety  procedures. What's next? Visit your health care provider once a year for an annual wellness visit. Ask your health care provider how often you should have your eyes and teeth checked. Stay up to date on all vaccines. This information is not intended to replace advice given to you by your health care provider. Make sure you discuss any questions you have with your healthcare provider. Document Revised: 02/16/2019 Document Reviewed: 05/14/2018 Elsevier Patient Education  2022 [employer].

## 2021-01-24 NOTE — Progress Notes (Signed)
Please inform patient of the following:  Labs are all stable.  Would like for him to keep up the good work and we can recheck in a year or so.

## 2021-02-16 ENCOUNTER — Other Ambulatory Visit: Payer: Self-pay | Admitting: Family Medicine

## 2021-03-08 ENCOUNTER — Other Ambulatory Visit: Payer: Self-pay | Admitting: Family Medicine

## 2021-10-03 ENCOUNTER — Telehealth: Payer: Self-pay | Admitting: Family Medicine

## 2021-10-03 NOTE — Telephone Encounter (Signed)
Copied from CRM [id number]. Topic: Medicare AWV ?>> Oct 03, 2021 10:02 AM Harris-Coley, Avon Gully wrote: ?Reason for CRM: Left message for patient to schedule Annual Wellness Visit.  Please schedule with Nurse Health Advisor Lanier Ensign, RN at [facility].  Please call [phone] ask for Olegario Messier ?

## 2022-01-21 ENCOUNTER — Telehealth: Payer: Self-pay | Admitting: Family Medicine

## 2022-01-21 NOTE — Telephone Encounter (Signed)
Patient is requesting that labs for physical on 01/24/22 be ordered prior to the visit so results can be reviewed during physical.    Patient will be checking with insurance to make sure these labs would be paid for if ordered/collected prior to 08/24.

## 2022-01-24 ENCOUNTER — Ambulatory Visit (INDEPENDENT_AMBULATORY_CARE_PROVIDER_SITE_OTHER): Payer: PPO | Admitting: Family Medicine

## 2022-01-24 ENCOUNTER — Telehealth: Payer: Self-pay | Admitting: *Deleted

## 2022-01-24 ENCOUNTER — Encounter: Payer: Self-pay | Admitting: Family Medicine

## 2022-01-24 VITALS — BP 130/71 | HR 65 | Temp 97.8°F | Ht 70.0 in | Wt 173.8 lb

## 2022-01-24 DIAGNOSIS — E785 Hyperlipidemia, unspecified: Secondary | ICD-10-CM

## 2022-01-24 DIAGNOSIS — R251 Tremor, unspecified: Secondary | ICD-10-CM | POA: Diagnosis not present

## 2022-01-24 DIAGNOSIS — Z0001 Encounter for general adult medical examination with abnormal findings: Secondary | ICD-10-CM | POA: Diagnosis not present

## 2022-01-24 DIAGNOSIS — R739 Hyperglycemia, unspecified: Secondary | ICD-10-CM | POA: Diagnosis not present

## 2022-01-24 DIAGNOSIS — Z125 Encounter for screening for malignant neoplasm of prostate: Secondary | ICD-10-CM

## 2022-01-24 DIAGNOSIS — K219 Gastro-esophageal reflux disease without esophagitis: Secondary | ICD-10-CM | POA: Diagnosis not present

## 2022-01-24 LAB — COMPREHENSIVE METABOLIC PANEL
ALT: 13 U/L (ref 0–53)
AST: 19 U/L (ref 0–37)
Albumin: 4.4 g/dL (ref 3.5–5.2)
Alkaline Phosphatase: 87 U/L (ref 39–117)
BUN: 17 mg/dL (ref 6–23)
CO2: 29 mEq/L (ref 19–32)
Calcium: 9.4 mg/dL (ref 8.4–10.5)
Chloride: 104 mEq/L (ref 96–112)
Creatinine, Ser: 1 mg/dL (ref 0.40–1.50)
GFR: 78.58 mL/min (ref >60.00)
Glucose, Bld: 91 mg/dL (ref 70–99)
Potassium: 5.4 mEq/L — ABNORMAL HIGH (ref 3.5–5.1)
Sodium: 143 mEq/L (ref 135–145)
Total Bilirubin: 0.9 mg/dL (ref 0.2–1.2)
Total Protein: 6.5 g/dL (ref 6.0–8.3)

## 2022-01-24 LAB — LIPID PANEL
Cholesterol: 164 mg/dL (ref 0–200)
HDL: 49.7 mg/dL (ref >39.00)
LDL Cholesterol: 93 mg/dL (ref 0–99)
NonHDL: 113.91
Total CHOL/HDL Ratio: 3
Triglycerides: 105 mg/dL (ref 0.0–149.0)
VLDL: 21 mg/dL (ref 0.0–40.0)

## 2022-01-24 LAB — CBC
HCT: 50.1 % (ref 39.0–52.0)
Hemoglobin: 16.9 g/dL (ref 13.0–17.0)
MCHC: 33.7 g/dL (ref 30.0–36.0)
MCV: 92.2 fl (ref 78.0–100.0)
Platelets: 202 10*3/uL (ref 150.0–400.0)
RBC: 5.44 Mil/uL (ref 4.22–5.81)
RDW: 12.5 % (ref 11.5–15.5)
WBC: 5.4 10*3/uL (ref 4.0–10.5)

## 2022-01-24 LAB — PSA: PSA: 0.84 ng/mL (ref 0.10–4.00)

## 2022-01-24 LAB — HEMOGLOBIN A1C: Hgb A1c MFr Bld: 5.6 % (ref 4.6–6.5)

## 2022-01-24 LAB — TSH: TSH: 1.34 u[IU]/mL (ref 0.35–5.50)

## 2022-01-24 MED ORDER — AZITHROMYCIN 500 MG PO TABS: ORAL_TABLET | ORAL | 0 refills | Status: DC

## 2022-01-24 MED ORDER — ONDANSETRON HCL 8 MG PO TABS: 8.0000 mg | ORAL_TABLET | Freq: Three times a day (TID) | ORAL | 0 refills | Status: DC | PRN

## 2022-01-24 NOTE — Assessment & Plan Note (Signed)
Did not have much benefit with one-time dose propranolol previously.  We will continue with watchful waiting now.  We can try again at some point in the future to higher dose if he wishes.

## 2022-01-24 NOTE — Telephone Encounter (Signed)
(  Key: [id number]) The plan will fax you a determination, typically within 1 to 5 business days. Drug Ondansetron HCl 8MG  tablets

## 2022-01-24 NOTE — Patient Instructions (Signed)
It was very nice to see you today!  We will check blood work today.  Please continue work on diet and exercise.  I will see you back in a year for your next physical. Come back to see me sooner if needed.  Take care, Dr [name]  PLEASE NOTE:  If you had any lab tests please let us know if you have not heard back within a few days. You may see your results on mychart before we have a chance to review them but we will give you a call once they are reviewed by [place]. If we ordered any referrals today, please let us know if you have not heard from their office within the next week.   Please try these tips to maintain a healthy lifestyle:  Eat at least 3 REAL meals and 1-2 snacks per day.  Aim for no more than 5 hours between eating.  If you eat breakfast, please do so within one hour of getting up.   Each meal should contain half fruits/vegetables, one quarter protein, and one quarter carbs (no bigger than a computer mouse)  Cut down on sweet beverages. This includes juice, soda, and sweet tea.   Drink at least 1 glass of water with each meal and aim for at least 8 glasses per day  Exercise at least 150 minutes every week.    Preventive Care 2 Years and Older, Male Preventive care refers to lifestyle choices and visits with your health care provider that can promote health and wellness. Preventive care visits are also called wellness exams. What can I expect for my preventive care visit? Counseling During your preventive care visit, your health care provider may ask about your: Medical history, including: Past medical problems. Family medical history. History of falls. Current health, including: Emotional well-being. Home life and relationship well-being. Sexual activity. Memory and ability to understand (cognition). Lifestyle, including: Alcohol, nicotine or tobacco, and drug use. Access to firearms. Diet, exercise, and sleep habits. Work and work Astronomer. Sunscreen  use. Safety issues such as seatbelt and bike helmet use. Physical exam Your health care provider will check your: Height and weight. These may be used to calculate your BMI (body mass index). BMI is a measurement that tells if you are at a healthy weight. Waist circumference. This measures the distance around your waistline. This measurement also tells if you are at a healthy weight and may help predict your risk of certain diseases, such as type 2 diabetes and high blood pressure. Heart rate and blood pressure. Body temperature. Skin for abnormal spots. What immunizations do I need?  Vaccines are usually given at various ages, according to a schedule. Your health care provider will recommend vaccines for you based on your age, medical history, and lifestyle or other factors, such as travel or where you work. What tests do I need? Screening Your health care provider may recommend screening tests for certain conditions. This may include: Lipid and cholesterol levels. Diabetes screening. This is done by checking your blood sugar (glucose) after you have not eaten for a while (fasting). Hepatitis C test. Hepatitis B test. HIV (human immunodeficiency virus) test. STI (sexually transmitted infection) testing, if you are at risk. Lung cancer screening. Colorectal cancer screening. Prostate cancer screening. Abdominal aortic aneurysm (AAA) screening. You may need this if you are a current or former smoker. Talk with your health care provider about your test results, treatment options, and if necessary, the need for more tests. Follow these instructions at  home: Eating and drinking  Eat a diet that includes fresh fruits and vegetables, whole grains, lean protein, and low-fat dairy products. Limit your intake of foods with high amounts of sugar, saturated fats, and salt. Take vitamin and mineral supplements as recommended by your health care provider. Do not drink alcohol if your health care  provider tells you not to drink. If you drink alcohol: Limit how much you have to 0-2 drinks a day. Know how much alcohol is in your drink. In the [place] one drink equals one 12 oz bottle of beer (355 mL), one 5 oz glass of wine (148 mL), or one 1 oz glass of hard liquor (44 mL). Lifestyle Brush your teeth every morning and night with fluoride toothpaste. Floss one time each day. Exercise for at least 30 minutes 5 or more days each week. Do not use any products that contain nicotine or tobacco. These products include cigarettes, chewing tobacco, and vaping devices, such as e-cigarettes. If you need help quitting, ask your health care provider. Do not use drugs. If you are sexually active, practice safe sex. Use a condom or other form of protection to prevent STIs. Take aspirin only as told by your health care provider. Make sure that you understand how much to take and what form to take. Work with your health care provider to find out whether it is safe and beneficial for you to take aspirin daily. Ask your health care provider if you need to take a cholesterol-lowering medicine (statin). Find healthy ways to manage stress, such as: Meditation, yoga, or listening to music. Journaling. Talking to a trusted person. Spending time with friends and family. Safety Always wear your seat belt while driving or riding in a vehicle. Do not drive: If you have been drinking alcohol. Do not ride with someone who has been drinking. When you are tired or distracted. While texting. If you have been using any mind-altering substances or drugs. Wear a helmet and other protective equipment during sports activities. If you have firearms in your house, make sure you follow all gun safety procedures. Minimize exposure to UV radiation to reduce your risk of skin cancer. What's next? Visit your health care provider once a year for an annual wellness visit. Ask your health care provider how often you should have  your eyes and teeth checked. Stay up to date on all vaccines. This information is not intended to replace advice given to you by your health care provider. Make sure you discuss any questions you have with your health care provider. Document Revised: 11/15/2020 Document Reviewed: 11/15/2020 Elsevier Patient Education  [employer].

## 2022-01-24 NOTE — Telephone Encounter (Signed)
Labs done.

## 2022-01-24 NOTE — Assessment & Plan Note (Signed)
Check A1c. 

## 2022-01-24 NOTE — Progress Notes (Signed)
Chief Complaint:  Jeremy Garcia is a 66 y.o. male who presents today for his annual comprehensive physical exam.    Assessment/Plan:  New/Acute Problems: Encounter for travel advice We will give prescription for azithromycin and Zofran.  We discussed routine precautions.  He will be traveling to [place] next month.   Chronic Problems Addressed Today: Hyperglycemia Check A1c.  Dyslipidemia Check lipids.  Tremor Did not have much benefit with one-time dose propranolol previously.  We will continue with watchful waiting now.  We can try again at some point in the future to higher dose if he wishes.  GERD (gastroesophageal reflux disease) Stable off dexilant.   Preventative Healthcare: He will get flu vaccine later this year. Discussed shingles vaccine - he will consider. Check labs.   Patient Counseling(The following topics were reviewed and/or handout was given):  -Nutrition: Stressed importance of moderation in sodium/caffeine intake, saturated fat and cholesterol, caloric balance, sufficient intake of fresh fruits, vegetables, and fiber.  -Stressed the importance of regular exercise.   -Substance Abuse: Discussed cessation/primary prevention of tobacco, alcohol, or other drug use; driving or other dangerous activities under the influence; availability of treatment for abuse.   -Injury prevention: Discussed safety belts, safety helmets, smoke detector, smoking near bedding or upholstery.   -Sexuality: Discussed sexually transmitted diseases, partner selection, use of condoms, avoidance of unintended pregnancy and contraceptive alternatives.   -Dental health: Discussed importance of regular tooth brushing, flossing, and dental visits.  -Health maintenance and immunizations reviewed. Please refer to Health maintenance section.  Return to care in 1 year for next preventative visit.     Subjective:  HPI:  He has no acute complaints today. See A/p for status of chronic  conditions.   Lifestyle Diet: Trying to cut down on junk food and carbs.  Exercise: Walks daily. Ride bike for about an hour.      01/24/2022    8:03 AM  Depression screen PHQ 2/9  Decreased Interest 0  Down, Depressed, Hopeless 0  PHQ - 2 Score 0    There are no preventive care reminders to display for this patient.    ROS: Per HPI, otherwise a complete review of systems was negative.   PMH:  The following were reviewed and entered/updated in epic: Past Medical History:  Diagnosis Date   Anal fissure    GERD (gastroesophageal reflux disease)    Unspecified personal history presenting hazards to health    Patient Active Problem List   Diagnosis Date Noted   Hyperglycemia 01/23/2021   Dyslipidemia 12/09/2019   GERD (gastroesophageal reflux disease) 12/08/2019   Tremor 12/08/2019   History of pulmonary embolism 09/14/2019   Spondylolisthesis of lumbar region 08/19/2019   Erectile dysfunction 12/01/2018   Allergic rhinitis 12/01/2018   Past Surgical History:  Procedure Laterality Date   ANTERIOR LAT LUMBAR FUSION N/A 08/19/2019   Procedure: Lumbar three- Anterolateral lumbar interbody fusion with lateral plate fixation;  Surgeon: Barnett Abu, MD;  Location: [facility] OR;  Service: Neurosurgery;  Laterality: N/A;   BACK SURGERY     Due to sciatica   KNEE CARTILAGE SURGERY     Arthroscopy    Family History  Problem Relation Age of Onset   Hypertension Brother    Prostate cancer Father        66s   Coronary artery disease Mother     Medications- reviewed and updated Current Outpatient Medications  Medication Sig Dispense Refill   azithromycin (ZITHROMAX) 500 MG tablet Take 2 tablets (1000mg )  once for travelers diarrhea. 4 tablet 0   loratadine (CLARITIN) 10 MG tablet Take 10 mg by mouth daily.     ondansetron (ZOFRAN) 8 MG tablet Take 1 tablet (8 mg total) by mouth every 8 (eight) hours as needed for nausea or vomiting. 20 tablet 0   No current  facility-administered medications for this visit.    Allergies-reviewed and updated No Known Allergies  Social History   Socioeconomic History   Marital status: Married    Spouse name: Not on file   Number of children: Not on file   Years of education: Not on file   Highest education level: Not on file  Occupational History   Occupation: Transport planner  Tobacco Use   Smoking status: Never   Smokeless tobacco: Never  Vaping Use   Vaping Use: Never used  Substance and Sexual Activity   Alcohol use: Yes    Alcohol/week: 1.0 standard drink of alcohol    Types: 1 Standard drinks or equivalent per week   Drug use: Never   Sexual activity: Yes  Other Topics Concern   Not on file  Social History Narrative   Not on file   Social Determinants of Health   Financial Resource Strain: Not on file  Food Insecurity: Not on file  Transportation Needs: Not on file  Physical Activity: Not on file  Stress: Not on file  Social Connections: Not on file        Objective:  Physical Exam: BP 130/71   Pulse 65   Temp 97.8 F (36.6 C) (Temporal)   Ht 5\' 10"  (1.778 m)   Wt 173 lb 12.8 oz (78.8 kg)   SpO2 97%   BMI 24.94 kg/m   Body mass index is 24.94 kg/m. Wt Readings from Last 3 Encounters:  01/24/22 173 lb 12.8 oz (78.8 kg)  01/23/21 182 lb 12.8 oz (82.9 kg)  03/14/20 183 lb 6.4 oz (83.2 kg)   Gen: NAD, resting comfortably HEENT: TMs normal bilaterally. OP clear. No thyromegaly noted.  CV: RRR with no murmurs appreciated Pulm: NWOB, CTAB with no crackles, wheezes, or rhonchi GI: Normal bowel sounds present. Soft, Nontender, Nondistended. MSK: no edema, cyanosis, or clubbing noted Skin: warm, dry Neuro: CN2-12 grossly intact. Strength 5/5 in upper and lower extremities. Reflexes symmetric and intact bilaterally.  Psych: Normal affect and thought content     Nicodemus Denk M. 05/14/20, MD 01/24/2022 8:50 AM

## 2022-01-24 NOTE — Assessment & Plan Note (Signed)
Check lipids 

## 2022-01-24 NOTE — Assessment & Plan Note (Signed)
Stable off dexilant.

## 2022-01-25 ENCOUNTER — Encounter: Payer: Self-pay | Admitting: Family Medicine

## 2022-01-25 NOTE — Telephone Encounter (Signed)
PA denied  Not meet the criteria

## 2022-01-28 ENCOUNTER — Encounter: Payer: Self-pay | Admitting: Family Medicine

## 2022-01-28 NOTE — Telephone Encounter (Signed)
Please advise 

## 2022-01-29 NOTE — Telephone Encounter (Signed)
Recommend he check with insurance to see if they have any suggested alternatives.  Katina Degree. [name], MD 01/29/2022 7:35 AM

## 2022-01-29 NOTE — Telephone Encounter (Signed)
Ok to refill.  Dovie Kapusta M. Serra Younan, MD 01/29/2022 7:34 AM   

## 2022-01-29 NOTE — Progress Notes (Signed)
Please inform patient of the following:  I sounds elevated everything else stable.  This is likely a lab error.  He can come back in 1 to 2 weeks to recheck to be met to make sure his potassium levels are stable.  Can recheck everything else in a year.

## 2022-02-05 ENCOUNTER — Other Ambulatory Visit: Payer: Self-pay | Admitting: *Deleted

## 2022-02-05 MED ORDER — DEXLANSOPRAZOLE 60 MG PO CPDR: 60.0000 mg | DELAYED_RELEASE_CAPSULE | Freq: Every day | ORAL | 1 refills | Status: DC

## 2022-02-13 ENCOUNTER — Other Ambulatory Visit: Payer: Self-pay | Admitting: *Deleted

## 2022-02-13 ENCOUNTER — Telehealth: Payer: Self-pay | Admitting: Family Medicine

## 2022-02-13 DIAGNOSIS — E875 Hyperkalemia: Secondary | ICD-10-CM

## 2022-02-13 NOTE — Telephone Encounter (Signed)
Caller states: - Prior authorization for dexlansoprazole (DEXILANT) 60 MG capsule [[id number] dexlansoprazole (DEXILANT) 60 MG capsule [[id number]] sent to them  - They require additional information such as a diagnosis for the medication and if patient has tried and failed multiple PPIs including Omeprazole and pantoprazole sodium tablet delayed release.   Information can be communicated to [facility] at [phone].

## 2022-02-14 ENCOUNTER — Ambulatory Visit (INDEPENDENT_AMBULATORY_CARE_PROVIDER_SITE_OTHER): Payer: PPO | Admitting: Family

## 2022-02-14 ENCOUNTER — Other Ambulatory Visit: Payer: PPO

## 2022-02-14 ENCOUNTER — Encounter: Payer: Self-pay | Admitting: Family Medicine

## 2022-02-14 ENCOUNTER — Ambulatory Visit: Payer: PPO

## 2022-02-14 ENCOUNTER — Telehealth: Payer: Self-pay | Admitting: *Deleted

## 2022-02-14 ENCOUNTER — Encounter: Payer: Self-pay | Admitting: Family

## 2022-02-14 VITALS — BP 138/82 | HR 67 | Temp 98.2°F | Ht 70.0 in | Wt 172.4 lb

## 2022-02-14 DIAGNOSIS — E875 Hyperkalemia: Secondary | ICD-10-CM | POA: Diagnosis not present

## 2022-02-14 DIAGNOSIS — S70362A Insect bite (nonvenomous), left thigh, initial encounter: Secondary | ICD-10-CM | POA: Diagnosis not present

## 2022-02-14 DIAGNOSIS — Z23 Encounter for immunization: Secondary | ICD-10-CM | POA: Diagnosis not present

## 2022-02-14 DIAGNOSIS — W57XXXA Bitten or stung by nonvenomous insect and other nonvenomous arthropods, initial encounter: Secondary | ICD-10-CM | POA: Diagnosis not present

## 2022-02-14 LAB — BASIC METABOLIC PANEL
BUN: 22 mg/dL (ref 6–23)
CO2: 29 mEq/L (ref 19–32)
Calcium: 9.3 mg/dL (ref 8.4–10.5)
Chloride: 101 mEq/L (ref 96–112)
Creatinine, Ser: 1.09 mg/dL (ref 0.40–1.50)
GFR: 70.83 mL/min (ref >60.00)
Glucose, Bld: 88 mg/dL (ref 70–99)
Potassium: 4.4 mEq/L (ref 3.5–5.1)
Sodium: 139 mEq/L (ref 135–145)

## 2022-02-14 NOTE — Progress Notes (Signed)
Patient ID: Jeremy Garcia, male    DOB: 01/01/1956, 66 y.o.   MRN: [id number]  Chief Complaint  Patient presents with   Tick Removal    Pt states he has a tick bite on left thigh, Present for a day.Pt states he wiped it with a alcohol swab after removing tick. Some Redness but no fever.     HPI:      Tick bite:   left upper thigh, pinpoint erythema, pt found today and removed, tick not engorged, pt brought tick in, brownish-black in color, no numbness/tingling, no hardness, just a little knot at bite insertions site.  Assessment & Plan:  1. Need for immunization against influenza  - Flu Vaccine QUAD High Dose(Fluad)  2. Serum potassium elevated  - Basic Metabolic Panel (BMET)  3. Insect bite of left thigh, initial encounter left upper thigh, small amount of erythema. He is going out of the country next week and concerned if he needs tx. Discussed s/s to watch out for, if he notices these prior to his departure, I will send an abt for him.   Subjective:    Outpatient Medications Prior to Visit  Medication Sig Dispense Refill   loratadine (CLARITIN) 10 MG tablet Take 10 mg by mouth daily.     azithromycin (ZITHROMAX) 500 MG tablet Take 2 tablets (1000mg ) once for travelers diarrhea. (Patient not taking: Reported on 02/14/2022) 4 tablet 0   dexlansoprazole (DEXILANT) 60 MG capsule Take 1 capsule (60 mg total) by mouth daily. (Patient not taking: Reported on 02/14/2022) 30 capsule 1   ondansetron (ZOFRAN) 8 MG tablet Take 1 tablet (8 mg total) by mouth every 8 (eight) hours as needed for nausea or vomiting. (Patient not taking: Reported on 02/14/2022) 20 tablet 0   No facility-administered medications prior to visit.   Past Medical History:  Diagnosis Date   Anal fissure    GERD (gastroesophageal reflux disease)    Unspecified personal history presenting hazards to health    Past Surgical History:  Procedure Laterality Date   ANTERIOR LAT LUMBAR FUSION N/A 08/19/2019    Procedure: Lumbar three- Anterolateral lumbar interbody fusion with lateral plate fixation;  Surgeon: 08/21/2019, MD;  Location: [facility] OR;  Service: Neurosurgery;  Laterality: N/A;   BACK SURGERY     Due to sciatica   KNEE CARTILAGE SURGERY     Arthroscopy   No Known Allergies    Objective:    Physical Exam Vitals and nursing note reviewed.  Constitutional:      General: He is not in acute distress.    Appearance: Normal appearance.  HENT:     Head: Normocephalic.  Cardiovascular:     Rate and Rhythm: Normal rate and regular rhythm.  Pulmonary:     Effort: Pulmonary effort is normal.     Breath sounds: Normal breath sounds.  Musculoskeletal:        General: Normal range of motion.     Cervical back: Normal range of motion.  Skin:    General: Skin is warm and dry.     Findings: Lesion (pinpoint erythema, left anterior thigh, no induration noted) present.  Neurological:     Mental Status: He is alert and oriented to person, place, and time.  Psychiatric:        Mood and Affect: Mood normal.    BP 138/82 (BP Location: Left Arm, Patient Position: Sitting, Cuff Size: Large)   Pulse 67   Temp 98.2 F (36.8 C) (Temporal)  Ht 5\' 10"  (1.778 m)   Wt 172 lb 6.4 oz (78.2 kg)   SpO2 96%   BMI 24.74 kg/m  Wt Readings from Last 3 Encounters:  02/14/22 172 lb 6.4 oz (78.2 kg)  01/24/22 173 lb 12.8 oz (78.8 kg)  01/23/21 182 lb 12.8 oz (82.9 kg)       01/25/21, NP

## 2022-02-14 NOTE — Telephone Encounter (Signed)
Please advise 

## 2022-02-14 NOTE — Telephone Encounter (Signed)
(  Key: [id number]) The plan will fax you a determination, typically within 1 to 5 business days. Drug Ondansetron HCl 8MG  tablet

## 2022-02-14 NOTE — Patient Instructions (Signed)
It was very nice to see you today!   I will review your lab results via MyChart in a few days.  Keep an eye on your bite mark, any spreading of the redness into a "scratchy" rash or bulleye's (target) ring rash before you leave out of town, let me know and I will send a RX for an antibiotic. Also watch for fever, fatigue, tingling in leg (very rare!).      PLEASE NOTE:  If you had any lab tests please let us know if you have not heard back within a few days. You may see your results on MyChart before we have a chance to review them but we will give you a call once they are reviewed by [place]. If we ordered any referrals today, please let us know if you have not heard from their office within the next week.

## 2022-02-14 NOTE — Telephone Encounter (Signed)
15/20 should be fine.  Jeremy Garcia. [name], MD 02/14/2022 12:55 PM

## 2022-02-15 NOTE — Progress Notes (Signed)
Your potassium level is in normal range!  Have a great weekend.

## 2022-02-21 NOTE — Telephone Encounter (Signed)
PA was send to insurance

## 2022-02-22 NOTE — Telephone Encounter (Signed)
Approved form 02/19/2022 till 06/02/2022

## 2022-04-11 ENCOUNTER — Ambulatory Visit (INDEPENDENT_AMBULATORY_CARE_PROVIDER_SITE_OTHER): Payer: PPO

## 2022-04-11 VITALS — Wt 172.0 lb

## 2022-04-11 DIAGNOSIS — Z0000 Encounter for general adult medical examination without abnormal findings: Secondary | ICD-10-CM | POA: Diagnosis not present

## 2022-04-11 NOTE — Patient Instructions (Signed)
Jeremy Garcia , Thank you for taking time to come for your Medicare Wellness Visit. I appreciate your ongoing commitment to your health goals. Please review the following plan we discussed and let me know if I can assist you in the future.   These are the goals we discussed:  Goals      Patient Stated     Stay active         This is a list of the screening recommended for you and due dates:  Health Maintenance  Topic Date Due   COVID-19 Vaccine (4 - Pfizer series) 12/23/2020   Zoster (Shingles) Vaccine (1 of 2) 04/26/2022*   Medicare Annual Wellness Visit  04/12/2023   Colon Cancer Screening  12/28/2025   Tetanus Vaccine  11/30/2028   Pneumonia Vaccine  Completed   Flu Shot  Completed   Hepatitis C Screening: USPSTF Recommendation to screen - Ages 18-79 yo.  Completed   HPV Vaccine  Aged Out  *Topic was postponed. The date shown is not the original due date.    Advanced directives: Please bring a copy of your health care power of attorney and living will to the office at your convenience.  Conditions/risks identified: stay active   Next appointment: Follow up in one year for your annual wellness visit.   Preventive Care 66 Years and Older, Male  Preventive care refers to lifestyle choices and visits with your health care provider that can promote health and wellness. What does preventive care include? A yearly physical exam. This is also called an annual well check. Dental exams once or twice a year. Routine eye exams. Ask your health care provider how often you should have your eyes checked. Personal lifestyle choices, including: Daily care of your teeth and gums. Regular physical activity. Eating a healthy diet. Avoiding tobacco and drug use. Limiting alcohol use. Practicing safe sex. Taking low doses of aspirin every day. Taking vitamin and mineral supplements as recommended by your health care provider. What happens during an annual well check? The services and  screenings done by your health care provider during your annual well check will depend on your age, overall health, lifestyle risk factors, and family history of disease. Counseling  Your health care provider may ask you questions about your: Alcohol use. Tobacco use. Drug use. Emotional well-being. Home and relationship well-being. Sexual activity. Eating habits. History of falls. Memory and ability to understand (cognition). Work and work Astronomer. Screening  You may have the following tests or measurements: Height, weight, and BMI. Blood pressure. Lipid and cholesterol levels. These may be checked every 5 years, or more frequently if you are over 74 years old. Skin check. Lung cancer screening. You may have this screening every year starting at age 16 if you have a 30-pack-year history of smoking and currently smoke or have quit within the past 15 years. Fecal occult blood test (FOBT) of the stool. You may have this test every year starting at age 52. Flexible sigmoidoscopy or colonoscopy. You may have a sigmoidoscopy every 5 years or a colonoscopy every 10 years starting at age 21. Prostate cancer screening. Recommendations will vary depending on your family history and other risks. Hepatitis C blood test. Hepatitis B blood test. Sexually transmitted disease (STD) testing. Diabetes screening. This is done by checking your blood sugar (glucose) after you have not eaten for a while (fasting). You may have this done every 1-3 years. Abdominal aortic aneurysm (AAA) screening. You may need this if you are  a current or former smoker. Osteoporosis. You may be screened starting at age 40 if you are at high risk. Talk with your health care provider about your test results, treatment options, and if necessary, the need for more tests. Vaccines  Your health care provider may recommend certain vaccines, such as: Influenza vaccine. This is recommended every year. Tetanus, diphtheria, and  acellular pertussis (Tdap, Td) vaccine. You may need a Td booster every 10 years. Zoster vaccine. You may need this after age 71. Pneumococcal 13-valent conjugate (PCV13) vaccine. One dose is recommended after age 26. Pneumococcal polysaccharide (PPSV23) vaccine. One dose is recommended after age 85. Talk to your health care provider about which screenings and vaccines you need and how often you need them. This information is not intended to replace advice given to you by your health care provider. Make sure you discuss any questions you have with your health care provider. Document Released: 06/16/2015 Document Revised: 02/07/2016 Document Reviewed: 03/21/2015 Elsevier Interactive Patient Education  2017 [employer] Prevention in the Home Falls can cause injuries. They can happen to people of all ages. There are many things you can do to make your home safe and to help prevent falls. What can I do on the outside of my home? Regularly fix the edges of walkways and driveways and fix any cracks. Remove anything that might make you trip as you walk through a door, such as a raised step or threshold. Trim any bushes or trees on the path to your home. Use bright outdoor lighting. Clear any walking paths of anything that might make someone trip, such as rocks or tools. Regularly check to see if handrails are loose or broken. Make sure that both sides of any steps have handrails. Any raised decks and porches should have guardrails on the edges. Have any leaves, snow, or ice cleared regularly. Use sand or salt on walking paths during winter. Clean up any spills in your garage right away. This includes oil or grease spills. What can I do in the bathroom? Use night lights. Install grab bars by the toilet and in the tub and shower. Do not use towel bars as grab bars. Use non-skid mats or decals in the tub or shower. If you need to sit down in the shower, use a plastic, non-slip stool. Keep  the floor dry. Clean up any water that spills on the floor as soon as it happens. Remove soap buildup in the tub or shower regularly. Attach bath mats securely with double-sided non-slip rug tape. Do not have throw rugs and other things on the floor that can make you trip. What can I do in the bedroom? Use night lights. Make sure that you have a light by your bed that is easy to reach. Do not use any sheets or blankets that are too big for your bed. They should not hang down onto the floor. Have a firm chair that has side arms. You can use this for support while you get dressed. Do not have throw rugs and other things on the floor that can make you trip. What can I do in the kitchen? Clean up any spills right away. Avoid walking on wet floors. Keep items that you use a lot in easy-to-reach places. If you need to reach something above you, use a strong step stool that has a grab bar. Keep electrical cords out of the way. Do not use floor polish or wax that makes floors slippery. If you must  use wax, use non-skid floor wax. Do not have throw rugs and other things on the floor that can make you trip. What can I do with my stairs? Do not leave any items on the stairs. Make sure that there are handrails on both sides of the stairs and use them. Fix handrails that are broken or loose. Make sure that handrails are as long as the stairways. Check any carpeting to make sure that it is firmly attached to the stairs. Fix any carpet that is loose or worn. Avoid having throw rugs at the top or bottom of the stairs. If you do have throw rugs, attach them to the floor with carpet tape. Make sure that you have a light switch at the top of the stairs and the bottom of the stairs. If you do not have them, ask someone to add them for you. What else can I do to help prevent falls? Wear shoes that: Do not have high heels. Have rubber bottoms. Are comfortable and fit you well. Are closed at the toe. Do not  wear sandals. If you use a stepladder: Make sure that it is fully opened. Do not climb a closed stepladder. Make sure that both sides of the stepladder are locked into place. Ask someone to hold it for you, if possible. Clearly mark and make sure that you can see: Any grab bars or handrails. First and last steps. Where the edge of each step is. Use tools that help you move around (mobility aids) if they are needed. These include: Canes. Walkers. Scooters. Crutches. Turn on the lights when you go into a dark area. Replace any light bulbs as soon as they burn out. Set up your furniture so you have a clear path. Avoid moving your furniture around. If any of your floors are uneven, fix them. If there are any pets around you, be aware of where they are. Review your medicines with your doctor. Some medicines can make you feel dizzy. This can increase your chance of falling. Ask your doctor what other things that you can do to help prevent falls. This information is not intended to replace advice given to you by your health care provider. Make sure you discuss any questions you have with your health care provider. Document Released: 03/16/2009 Document Revised: 10/26/2015 Document Reviewed: 06/24/2014 Elsevier Interactive Patient Education  2017 [employer].

## 2022-04-11 NOTE — Progress Notes (Signed)
I connected with  Jeremy Garcia on 04/11/22 by a audio enabled telemedicine application and verified that I am speaking with the correct person using two identifiers.  Patient Location: Home  Provider Location: Office/Clinic  I discussed the limitations of evaluation and management by telemedicine. The patient expressed understanding and agreed to proceed.   Subjective:   Jeremy Garcia is a 66 y.o. male who presents for an Initial Medicare Annual Wellness Visit.  Review of Systems     Cardiac Risk Factors include: advanced age (>55men, >70 women);dyslipidemia;male gender     Objective:    Today's Vitals   04/11/22 1102  Weight: 172 lb (78 kg)   Body mass index is 24.68 kg/m.     04/11/2022   11:06 AM 08/19/2019    6:06 AM 08/16/2019   11:27 AM  Advanced Directives  Does Patient Have a Medical Advance Directive? Yes Yes Yes  Type of Paramedic of [place];Living will [place];Living will [place];Living will  Does patient want to make changes to medical advance directive?  No - Patient declined No - Patient declined  Copy of [place] in Chart? No - copy requested  No - copy requested    Current Medications (verified) Outpatient Encounter Medications as of 04/11/2022  Medication Sig   loratadine (CLARITIN) 10 MG tablet Take 10 mg by mouth daily.   [DISCONTINUED] azithromycin (ZITHROMAX) 500 MG tablet Take 2 tablets (1000mg ) once for travelers diarrhea. (Patient not taking: Reported on 02/14/2022)   [DISCONTINUED] dexlansoprazole (DEXILANT) 60 MG capsule Take 1 capsule (60 mg total) by mouth daily. (Patient not taking: Reported on 02/14/2022)   [DISCONTINUED] ondansetron (ZOFRAN) 8 MG tablet Take 1 tablet (8 mg total) by mouth every 8 (eight) hours as needed for nausea or vomiting. (Patient not taking: Reported on 02/14/2022)   No facility-administered encounter medications on file as of  04/11/2022.    Allergies (verified) Patient has no known allergies.   History: Past Medical History:  Diagnosis Date   Anal fissure    GERD (gastroesophageal reflux disease)    Unspecified personal history presenting hazards to health    Past Surgical History:  Procedure Laterality Date   ANTERIOR LAT LUMBAR FUSION N/A 08/19/2019   Procedure: Lumbar three- Anterolateral lumbar interbody fusion with lateral plate fixation;  Surgeon: Kristeen Miss, MD;  Location: [place] [facility];  Service: Neurosurgery;  Laterality: N/A;   BACK SURGERY     Due to sciatica   KNEE CARTILAGE SURGERY     Arthroscopy   Family History  Problem Relation Age of Onset   Hypertension Brother    Prostate cancer Father        21s   Coronary artery disease Mother    Social History   Socioeconomic History   Marital status: Married    Spouse name: Not on file   Number of children: Not on file   Years of education: Not on file   Highest education level: Not on file  Occupational History   Occupation: Tree surgeon  Tobacco Use   Smoking status: Never   Smokeless tobacco: Never  Vaping Use   Vaping Use: Never used  Substance and Sexual Activity   Alcohol use: Yes    Alcohol/week: 1.0 standard drink of alcohol    Types: 1 Standard drinks or equivalent per week   Drug use: Never   Sexual activity: Yes  Other Topics Concern   Not on file  Social  History Narrative   Not on file   Social Determinants of Health   Financial Resource Strain: Low Risk  (04/11/2022)   Overall Financial Resource Strain (CARDIA)    Difficulty of Paying Living Expenses: Not hard at all  Food Insecurity: No Food Insecurity (04/11/2022)   Hunger Vital Sign    Worried About Running Out of Food in the Last Year: Never true    Ran Out of Food in the Last Year: Never true  Transportation Needs: No Transportation Needs (04/11/2022)   PRAPARE - Hydrologist (Medical): No    Lack of Transportation  (Non-Medical): No  Physical Activity: Sufficiently Active (04/11/2022)   Exercise Vital Sign    Days of Exercise per Week: 7 days    Minutes of Exercise per Session: 60 min  Stress: No Stress Concern Present (04/11/2022)   [place]    Feeling of Stress : Not at all  Social Connections: Moderately Integrated (04/11/2022)   Social Connection and Isolation Panel [NHANES]    Frequency of Communication with Friends and Family: More than three times a week    Frequency of Social Gatherings with Friends and Family: More than three times a week    Attends Religious Services: More than 4 times per year    Active Member of [employer] or Organizations: No    Attends Music therapist: Never    Marital Status: Married    Tobacco Counseling Counseling given: Not Answered   Clinical Intake:  Pre-visit preparation completed: Yes  Pain : No/denies pain     BMI - recorded: 24.68 Nutritional Status: BMI of 19-24  Normal Nutritional Risks: None Diabetes: No  How often do you need to have someone help you when you read instructions, pamphlets, or other written materials from your doctor or pharmacy?: 1 - Never  Diabetic?no   Interpreter Needed?: No  Information entered by :: Charlott Rakes, LPN   Activities of Daily Living    04/11/2022   11:07 AM  In your present state of health, do you have any difficulty performing the following activities:  Hearing? 0  Vision? 0  Difficulty concentrating or making decisions? 0  Walking or climbing stairs? 0  Dressing or bathing? 0  Doing errands, shopping? 0  Preparing Food and eating ? N  Using the Toilet? N  In the past six months, have you accidently leaked urine? N  Do you have problems with loss of bowel control? N  Managing your Medications? N  Managing your Finances? N  Housekeeping or managing your Housekeeping? N    Patient Care Team: Vivi Barrack, MD  as PCP - General (Family Medicine)  Indicate any recent Medical Services you may have received from other than [facility] providers in the past year (date may be approximate).     Assessment:   This is a routine wellness examination for Jeremy Garcia.  Hearing/Vision screen Hearing Screening - Comments:: Pt denies any hearing issues  Vision Screening - Comments:: Pt follows up with Dr Oswaldo Conroy for annual eye exams   Dietary issues and exercise activities discussed: Current Exercise Habits: Home exercise routine, Type of exercise: Other - see comments, Time (Minutes): 60, Frequency (Times/Week): 7, Weekly Exercise (Minutes/Week): 420   Goals Addressed             This Visit's Progress    Patient Stated       Stay active  Depression Screen    04/11/2022   11:04 AM 01/24/2022    8:03 AM 01/23/2021    9:03 AM 03/14/2020    2:21 PM 12/01/2018    8:14 AM  PHQ 2/9 Scores  PHQ - 2 Score 0 0 0 0 0  PHQ- 9 Score    0     Fall Risk    04/11/2022   11:06 AM 01/24/2022    8:03 AM 01/23/2021    9:03 AM  [place] in the past year? 0 0 0  Number falls in past yr: 0 0 0  Injury with Fall? 0 0 0  Risk for fall due to : Impaired vision No Fall Risks No Fall Risks  Follow up Falls prevention discussed      FALL RISK PREVENTION PERTAINING TO THE HOME:  Any stairs in or around the home? Yes  If so, are there any without handrails? No  Home free of loose throw rugs in walkways, pet beds, electrical cords, etc? Yes  Adequate lighting in your home to reduce risk of falls? Yes   ASSISTIVE DEVICES UTILIZED TO PREVENT FALLS:  Life alert? No  Use of a cane, walker or w/c? No  Grab bars in the bathroom? No  Shower chair or bench in shower? No  Elevated toilet seat or a handicapped toilet? No   TIMED UP AND GO:  Was the test performed? No .   Cognitive Function:        04/11/2022   11:07 AM  6CIT Screen  What Year? 0 points  What month? 0 points  What time? 0 points  Count  back from 20 0 points  Months in reverse 0 points  Repeat phrase 0 points  Total Score 0 points    Immunizations Immunization History  Administered Date(s) Administered   Fluad Quad(high Dose 65+) 02/14/2022   Hep A / Hep B 02/28/2006, 03/31/2006, 10/28/2006   Influenza, High Dose Seasonal PF 04/03/2021   Influenza,inj,Quad PF,6+ Mos 02/28/2018, 03/03/2019, 02/10/2020   Influenza-Unspecified 04/13/2012, 04/28/2013, 03/23/2014, 02/10/2015   PFIZER Comirnaty(Gray Top)Covid-19 Tri-Sucrose Vaccine 10/28/2020   PFIZER(Purple Top)SARS-COV-2 Vaccination 08/28/2019, 09/18/2019   PNEUMOCOCCAL CONJUGATE-20 01/23/2021   Tdap 02/28/2006, 12/01/2018   Typhoid Inactivated 02/28/2006    TDAP status: Up to date  Flu Vaccine status: Up to date  Pneumococcal vaccine status: Up to date  Covid-19 vaccine status: Completed vaccines  Qualifies for Shingles Vaccine? Yes   Zostavax completed No   Shingrix Completed?: No.    Education has been provided regarding the importance of this vaccine. Patient has been advised to call insurance company to determine out of pocket expense if they have not yet received this vaccine. Advised may also receive vaccine at local pharmacy or Health Dept. Verbalized acceptance and understanding.  Screening Tests Health Maintenance  Topic Date Due   COVID-19 Vaccine (4 - Pfizer series) 12/23/2020   Zoster Vaccines- Shingrix (1 of 2) 04/26/2022 (Originally 10/30/2005)   Medicare Annual Wellness (AWV)  04/12/2023   COLONOSCOPY (Pts 45-74yrs Insurance coverage will need to be confirmed)  12/28/2025   TETANUS/TDAP  11/30/2028   Pneumonia Vaccine 87+ Years old  Completed   INFLUENZA VACCINE  Completed   Hepatitis C Screening  Completed   HPV VACCINES  Aged Out    Health Maintenance  Health Maintenance Due  Topic Date Due   COVID-19 Vaccine (4 - Pfizer series) 12/23/2020    Colorectal cancer screening: Type of screening: Colonoscopy. Completed 12/29/15. Repeat  every 10 years  Additional Screening:  Hepatitis C Screening:  Completed 12/01/18  Vision Screening: Recommended annual ophthalmology exams for early detection of glaucoma and other disorders of the eye. Is the patient up to date with their annual eye exam?  Yes  Who is the provider or what is the name of the office in which the patient attends annual eye exams? Dr Cherlynn Polo  If pt is not established with a provider, would they like to be referred to a provider to establish care? No .   Dental Screening: Recommended annual dental exams for proper oral hygiene  Community Resource Referral / Chronic Care Management: CRR required this visit?  No   CCM required this visit?  No      Plan:     I have personally reviewed and noted the following in the patient's chart:   Medical and social history Use of alcohol, tobacco or illicit drugs  Current medications and supplements including opioid prescriptions. Patient is not currently taking opioid prescriptions. Functional ability and status Nutritional status Physical activity Advanced directives List of other physicians Hospitalizations, surgeries, and ER visits in previous 12 months Vitals Screenings to include cognitive, depression, and falls Referrals and appointments  In addition, I have reviewed and discussed with patient certain preventive protocols, quality metrics, and best practice recommendations. A written personalized care plan for preventive services as well as general preventive health recommendations were provided to patient.     Marzella Schlein, LPN   [id number]/01/[id number]   Nurse Notes: [facility]

## 2022-08-12 DIAGNOSIS — H524 Presbyopia: Secondary | ICD-10-CM | POA: Diagnosis not present

## 2022-08-12 DIAGNOSIS — H52223 Regular astigmatism, bilateral: Secondary | ICD-10-CM | POA: Diagnosis not present

## 2022-08-12 DIAGNOSIS — H5213 Myopia, bilateral: Secondary | ICD-10-CM | POA: Diagnosis not present

## 2023-01-01 ENCOUNTER — Encounter (INDEPENDENT_AMBULATORY_CARE_PROVIDER_SITE_OTHER): Payer: Self-pay

## 2023-01-03 ENCOUNTER — Ambulatory Visit (INDEPENDENT_AMBULATORY_CARE_PROVIDER_SITE_OTHER): Payer: PPO

## 2023-01-03 ENCOUNTER — Encounter (HOSPITAL_BASED_OUTPATIENT_CLINIC_OR_DEPARTMENT_OTHER): Payer: Self-pay | Admitting: Student

## 2023-01-03 ENCOUNTER — Other Ambulatory Visit (HOSPITAL_BASED_OUTPATIENT_CLINIC_OR_DEPARTMENT_OTHER): Payer: Self-pay

## 2023-01-03 ENCOUNTER — Ambulatory Visit: Payer: PPO | Admitting: Physician Assistant

## 2023-01-03 ENCOUNTER — Encounter: Payer: Self-pay | Admitting: Physician Assistant

## 2023-01-03 ENCOUNTER — Ambulatory Visit (HOSPITAL_BASED_OUTPATIENT_CLINIC_OR_DEPARTMENT_OTHER): Payer: PPO | Admitting: Student

## 2023-01-03 VITALS — BP 160/80 | HR 73 | Temp 97.7°F | Ht 70.0 in | Wt 181.4 lb

## 2023-01-03 DIAGNOSIS — M7121 Synovial cyst of popliteal space [Baker], right knee: Secondary | ICD-10-CM

## 2023-01-03 DIAGNOSIS — M25561 Pain in right knee: Secondary | ICD-10-CM

## 2023-01-03 DIAGNOSIS — G8929 Other chronic pain: Secondary | ICD-10-CM

## 2023-01-03 DIAGNOSIS — M1711 Unilateral primary osteoarthritis, right knee: Secondary | ICD-10-CM | POA: Diagnosis not present

## 2023-01-03 MED ORDER — MELOXICAM 15 MG PO TABS
15.0000 mg | ORAL_TABLET | Freq: Every day | ORAL | 0 refills | Status: AC
  Filled 2023-01-03: qty 10, 10d supply, fill #0

## 2023-01-03 NOTE — Patient Instructions (Signed)
It was great to see you!  They are you going to see you around 1p. Please head over around that time.    Take care,  Jarold Motto PA-C

## 2023-01-03 NOTE — Progress Notes (Signed)
Jeremy Garcia is a 67 y.o. male here for a new problem.  History of Present Illness:   Chief Complaint  Patient presents with   Knee Pain    Pt c/o right knee pain, pt slipped on an embankment back in April, now has a lump behind knee. Ibuprofen 800 mg BID.    Right Knee Pain: He complains today of right knee pain that started after having a fall back in April where his leg was underneath his knee.  His pain tends to mostly occur in his posterior knee region, no complaints in the anterior or patella region. He's also unable to completely bend his right knee.  He also has an accompanying lump behind his right knee but is unsure if it's related to the fall. He has been icing his knee and is currently taking Ibprofen 800 mg BID to help relief his symptoms that he started 2 days ago. He continues to have daily walks of about 3 miles.   He also reports having a previous blood clot in his legs. He did not have any pain in his legs but was experiencing chest pain and a cough.   He also had an pulmonary embolism back in 2021. His pulmonary embolism occurred after having a procedure done.   Past Medical History:  Diagnosis Date   Anal fissure    GERD (gastroesophageal reflux disease)    Unspecified personal history presenting hazards to health      Social History   Tobacco Use   Smoking status: Never   Smokeless tobacco: Never  Vaping Use   Vaping status: Never Used  Substance Use Topics   Alcohol use: Yes    Alcohol/week: 1.0 standard drink of alcohol    Types: 1 Standard drinks or equivalent per week   Drug use: Never    Past Surgical History:  Procedure Laterality Date   ANTERIOR LAT LUMBAR FUSION N/A 08/19/2019   Procedure: Lumbar three- Anterolateral lumbar interbody fusion with lateral plate fixation;  Surgeon: Barnett Abu, MD;  Location: [facility];  Service: Neurosurgery;  Laterality: N/A;   BACK SURGERY     Due to sciatica   KNEE CARTILAGE SURGERY     Arthroscopy     Family History  Problem Relation Age of Onset   Hypertension Brother    Prostate cancer Father        76s   Coronary artery disease Mother     No Known Allergies  Current Medications:   Current Outpatient Medications:    loratadine (CLARITIN) 10 MG tablet, Take 10 mg by mouth daily., Disp: , Rfl:    Review of Systems:   Review of Systems  Musculoskeletal:  Positive for joint pain (knee pain).    Vitals:   Vitals:   01/03/23 1122  BP: (!) 160/80  Pulse: 73  Temp: 97.7 F (36.5 C)  TempSrc: Temporal  SpO2: 95%  Weight: 181 lb 6.1 oz (82.3 kg)  Height: 5\' 10"  (1.778 m)     Body mass index is 26.03 kg/m.  Physical Exam:   Physical Exam Vitals and nursing note reviewed.  Constitutional:      General: He is not in acute distress.    Appearance: He is well-developed. He is not ill-appearing or toxic-appearing.  Cardiovascular:     Rate and Rhythm: Normal rate and regular rhythm.     Pulses: Normal pulses.     Heart sounds: Normal heart sounds, S1 normal and S2 normal.  Pulmonary:  Effort: Pulmonary effort is normal.     Breath sounds: Normal breath sounds.  Musculoskeletal:     Comments: Large cystic mass to posterior aspect of right knee No induration or erythema No calf tenderness or swelling No lower extremity edema  Skin:    General: Skin is warm and dry.  Neurological:     Mental Status: He is alert.     GCS: GCS eye subscore is 4. GCS verbal subscore is 5. GCS motor subscore is 6.  Psychiatric:        Speech: Speech normal.        Behavior: Behavior normal. Behavior is cooperative.     Assessment and Plan:   Acute pain of right knee Suspect likely Baker's cyst Thankfully we were able to secure close follow-up with our orthopedic team at [facility] for 1p today for further evaluation  I did discuss with him that I cannot rule out blood clot based on physical exam No other symptoms to suggest DVT at this time If patient  develops any chest pain, shortness of breath, or other concerns -- needs to go to the ER   I,Safa M Kadhim,acting as a scribe for [employer], PA.,have documented all relevant documentation on the behalf of Jarold Motto, PA,as directed by  Jarold Motto, PA while in the presence of Jarold Motto, [place].   I, Jarold Motto, [place], have reviewed all documentation for this visit. The documentation on 01/03/23 for the exam, diagnosis, procedures, and orders are all accurate and complete.   Jarold Motto, PA-C

## 2023-01-03 NOTE — Progress Notes (Signed)
Chief Complaint: Left knee pain and swelling     History of Present Illness:    Jeremy Garcia is a 67 y.o. male presenting today for evaluation of pain and swelling in his right knee.  He was referred over from his primary care provider Jarold Motto, PA-C.  Patient states that the issues with his right knee began back in April after he slid down an embankment with his right leg tucked underneath him.  He has had mild symptoms off and on since then, but now reports increased discomfort.  Pain levels are now moderate and he reports significant swelling in the back of the knee.  He does notice this while walking and weightbearing.  It has become even more bothersome after he mowed his lawn yesterday.  Has been taking ibuprofen for pain relief.  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   Surgical History:   None  PMH/PSH/Family History/Social History/Meds/Allergies:    Past Medical History:  Diagnosis Date   Anal fissure    GERD (gastroesophageal reflux disease)    Unspecified personal history presenting hazards to health    Past Surgical History:  Procedure Laterality Date   ANTERIOR LAT LUMBAR FUSION N/A 08/19/2019   Procedure: Lumbar three- Anterolateral lumbar interbody fusion with lateral plate fixation;  Surgeon: Barnett Abu, MD;  Location: [facility];  Service: Neurosurgery;  Laterality: N/A;   BACK SURGERY     Due to sciatica   KNEE CARTILAGE SURGERY     Arthroscopy   Social History   Socioeconomic History   Marital status: Married    Spouse name: Not on file   Number of children: Not on file   Years of education: Not on file   Highest education level: Not on file  Occupational History   Occupation: Transport planner  Tobacco Use   Smoking status: Never   Smokeless tobacco: Never  Vaping Use   Vaping status: Never Used  Substance and Sexual Activity   Alcohol use: Yes    Alcohol/week: 1.0 standard drink of  alcohol    Types: 1 Standard drinks or equivalent per week   Drug use: Never   Sexual activity: Yes  Other Topics Concern   Not on file  Social History Narrative   Not on file   Social Determinants of Health   Financial Resource Strain: Low Risk  (04/11/2022)   Overall Financial Resource Strain (CARDIA)    Difficulty of Paying Living Expenses: Not hard at all  Food Insecurity: No Food Insecurity (04/11/2022)   Hunger Vital Sign    Worried About Running Out of Food in the Last Year: Never true    Ran Out of Food in the Last Year: Never true  Transportation Needs: No Transportation Needs (04/11/2022)   PRAPARE - Administrator, Civil Service (Medical): No    Lack of Transportation (Non-Medical): No  Physical Activity: Sufficiently Active (04/11/2022)   Exercise Vital Sign    Days of Exercise per Week: 7 days    Minutes of Exercise per Session: 60 min  Stress: No Stress Concern Present (04/11/2022)   [employer] of Occupational Health - Occupational Stress Questionnaire    Feeling of Stress : Not at all  Social Connections: Moderately Integrated (04/11/2022)   Social Connection and Isolation Panel [  NHANES]    Frequency of Communication with Friends and Family: More than three times a week    Frequency of Social Gatherings with Friends and Family: More than three times a week    Attends Religious Services: More than 4 times per year    Active Member of [employer] or Organizations: No    Attends Banker Meetings: Never    Marital Status: Married   Family History  Problem Relation Age of Onset   Hypertension Brother    Prostate cancer Father        43s   Coronary artery disease Mother    No Known Allergies Current Outpatient Medications  Medication Sig Dispense Refill   meloxicam (MOBIC) 15 MG tablet Take 1 tablet (15 mg total) by mouth daily for 10 days. 10 tablet 0   loratadine (CLARITIN) 10 MG tablet Take 10 mg by mouth daily.     No current  facility-administered medications for this visit.   No results found.  Review of Systems:   A ROS was performed including pertinent positives and negatives as documented in the HPI.  Physical Exam :   Constitutional: NAD and appears stated age Neurological: Alert and oriented Psych: Appropriate affect and cooperative There were no vitals taken for this visit.   Comprehensive Musculoskeletal Exam:    Large palpable Baker's cyst in the posterior right knee.  Trace to mild joint effusion.  No medial or lateral joint line tenderness.  Active range of motion from 0 to 100 degrees without crepitus.  No instability with varus or valgus stress.  Negative Lachman.  Neurosensory exam intact.  Imaging:   Xray (left knee 4 views): Medial joint space narrowing.  Small patellofemoral osteophytes.    I personally reviewed and interpreted the radiographs.   Assessment:   67 y.o. 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  No suspicion for ligamentous or meniscal injury, however he does have some medial compartment narrowing suggesting osteoarthritis.  No concerning findings suggestive of DVT.  Discussed with patient that direct aspiration of the Baker's cyst would have to performed under ultrasound guidance which I could refer him to Dr. Madelyn Brunner to have completed.  Also discussed that these are self-limited, and often improve with cortisone injection into the knee joint but patient has declined this at this time.  I do not believe there is a large enough effusion present for him to get significant relief from suprapatellar aspiration.  Recommend continuing with conservative treatment including ice, compression, and anti-inflammatories.  Discussed that should this continue to be bothersome, we can reconsider injection or have him referred to Dr. Shon Baton for aspiration.  Plan :    -Follow-up as needed     I personally saw and evaluated the patient, and participated in the management and  treatment plan.  Hazle Nordmann, PA-C Orthopedics  This document was dictated using Conservation officer, historic buildings. A reasonable attempt at proof reading has been made to minimize errors.

## 2023-01-30 ENCOUNTER — Encounter: Payer: Self-pay | Admitting: Family Medicine

## 2023-01-30 ENCOUNTER — Ambulatory Visit (INDEPENDENT_AMBULATORY_CARE_PROVIDER_SITE_OTHER): Payer: PPO | Admitting: Family Medicine

## 2023-01-30 VITALS — BP 125/74 | HR 69 | Temp 97.7°F | Ht 70.0 in | Wt 182.8 lb

## 2023-01-30 DIAGNOSIS — K219 Gastro-esophageal reflux disease without esophagitis: Secondary | ICD-10-CM | POA: Diagnosis not present

## 2023-01-30 DIAGNOSIS — M4316 Spondylolisthesis, lumbar region: Secondary | ICD-10-CM

## 2023-01-30 DIAGNOSIS — Z0001 Encounter for general adult medical examination with abnormal findings: Secondary | ICD-10-CM | POA: Diagnosis not present

## 2023-01-30 DIAGNOSIS — E785 Hyperlipidemia, unspecified: Secondary | ICD-10-CM

## 2023-01-30 DIAGNOSIS — Z125 Encounter for screening for malignant neoplasm of prostate: Secondary | ICD-10-CM | POA: Diagnosis not present

## 2023-01-30 DIAGNOSIS — R739 Hyperglycemia, unspecified: Secondary | ICD-10-CM

## 2023-01-30 DIAGNOSIS — M109 Gout, unspecified: Secondary | ICD-10-CM

## 2023-01-30 LAB — CBC
HCT: 50.8 % (ref 39.0–52.0)
Hemoglobin: 16.8 g/dL (ref 13.0–17.0)
MCHC: 33.1 g/dL (ref 30.0–36.0)
MCV: 92.4 fl (ref 78.0–100.0)
Platelets: 219 10*3/uL (ref 150.0–400.0)
RBC: 5.49 Mil/uL (ref 4.22–5.81)
RDW: 13 % (ref 11.5–15.5)
WBC: 5.2 10*3/uL (ref 4.0–10.5)

## 2023-01-30 LAB — LIPID PANEL
Cholesterol: 154 mg/dL (ref 0–200)
HDL: 44.5 mg/dL (ref >39.00)
LDL Cholesterol: 84 mg/dL (ref 0–99)
NonHDL: 109.06
Total CHOL/HDL Ratio: 3
Triglycerides: 125 mg/dL (ref 0.0–149.0)
VLDL: 25 mg/dL (ref 0.0–40.0)

## 2023-01-30 LAB — COMPREHENSIVE METABOLIC PANEL
ALT: 16 U/L (ref 0–53)
AST: 19 U/L (ref 0–37)
Albumin: 4.3 g/dL (ref 3.5–5.2)
Alkaline Phosphatase: 86 U/L (ref 39–117)
BUN: 17 mg/dL (ref 6–23)
CO2: 30 meq/L (ref 19–32)
Calcium: 9.1 mg/dL (ref 8.4–10.5)
Chloride: 104 meq/L (ref 96–112)
Creatinine, Ser: 1.02 mg/dL (ref 0.40–1.50)
GFR: 76.19 mL/min (ref >60.00)
Glucose, Bld: 88 mg/dL (ref 70–99)
Potassium: 4.5 meq/L (ref 3.5–5.1)
Sodium: 141 meq/L (ref 135–145)
Total Bilirubin: 0.8 mg/dL (ref 0.2–1.2)
Total Protein: 6.4 g/dL (ref 6.0–8.3)

## 2023-01-30 LAB — URIC ACID: Uric Acid, Serum: 6.3 mg/dL (ref 4.0–7.8)

## 2023-01-30 LAB — HEMOGLOBIN A1C: Hgb A1c MFr Bld: 5.5 % (ref 4.6–6.5)

## 2023-01-30 NOTE — Progress Notes (Signed)
Chief Complaint:  Jeremy Garcia is a 67 y.o. male who presents today for his annual comprehensive physical exam.    Assessment/Plan:  New/Acute Problems: Right Knee Pain Improving with conservative measures.  Advised him to follow back up with orthopedics if not improving over the next several weeks.  Chronic Problems Addressed Today: GERD (gastroesophageal reflux disease) Stable without medications.   Dyslipidemia Check lipids.  Discussed lifestyle modifications.  Hyperglycemia Check A1c.   Spondylolisthesis of lumbar region Having a little bit more paresthesias in his right lower extremity.  Improving with conservative measures at home including exercise and anti-inflammatories.  He will continue using these for now however if symptoms progress he will follow-up with neurosurgery.  He had a surgery performed a few years ago.  Preventative Healthcare: Check labs today.  Due for colonoscopy in a few years.  Up-to-date on vaccines.  Patient Counseling(The following topics were reviewed and/or handout was given):  -Nutrition: Stressed importance of moderation in sodium/caffeine intake, saturated fat and cholesterol, caloric balance, sufficient intake of fresh fruits, vegetables, and fiber.  -Stressed the importance of regular exercise.   -Substance Abuse: Discussed cessation/primary prevention of tobacco, alcohol, or other drug use; driving or other dangerous activities under the influence; availability of treatment for abuse.   -Injury prevention: Discussed safety belts, safety helmets, smoke detector, smoking near bedding or upholstery.   -Sexuality: Discussed sexually transmitted diseases, partner selection, use of condoms, avoidance of unintended pregnancy and contraceptive alternatives.   -Dental health: Discussed importance of regular tooth brushing, flossing, and dental visits.  -Health maintenance and immunizations reviewed. Please refer to Health maintenance  section.  Return to care in 1 year for next preventative visit.     Subjective:  HPI:  He has no acute complaints today. 6 he did have some issues with right knee pain a few weeks ago.  Went to orthopedics diagnosed with Baker's cyst.  They offered cortisone shot however he declined.  Symptoms are improving.  Lifestyle Diet: Balanced. Plenty of fruits and vegetables.  Exercise: Working on stationary bike 20 minutes per day.      01/30/2023    8:02 AM  Depression screen PHQ 2/9  Decreased Interest 0  Down, Depressed, Hopeless 0  PHQ - 2 Score 0    Health Maintenance Due  Topic Date Due   Zoster Vaccines- Shingrix (1 of 2) Never done     ROS: Per HPI, otherwise a complete review of systems was negative.   PMH:  The following were reviewed and entered/updated in epic: Past Medical History:  Diagnosis Date   Anal fissure    GERD (gastroesophageal reflux disease)    Unspecified personal history presenting hazards to health    Patient Active Problem List   Diagnosis Date Noted   Hyperglycemia 01/23/2021   Dyslipidemia 12/09/2019   GERD (gastroesophageal reflux disease) 12/08/2019   Tremor 12/08/2019   History of pulmonary embolism 09/14/2019   Spondylolisthesis of lumbar region 08/19/2019   Erectile dysfunction 12/01/2018   Allergic rhinitis 12/01/2018   Past Surgical History:  Procedure Laterality Date   ANTERIOR LAT LUMBAR FUSION N/A 08/19/2019   Procedure: Lumbar three- Anterolateral lumbar interbody fusion with lateral plate fixation;  Surgeon: Barnett Abu, MD;  Location: [facility] OR;  Service: Neurosurgery;  Laterality: N/A;   BACK SURGERY     Due to sciatica   KNEE CARTILAGE SURGERY     Arthroscopy    Family History  Problem Relation Age of Onset   Hypertension Brother  Prostate cancer Father        10s   Coronary artery disease Mother     Medications- reviewed and updated Current Outpatient Medications  Medication Sig Dispense Refill   loratadine  (CLARITIN) 10 MG tablet Take 10 mg by mouth daily.     No current facility-administered medications for this visit.    Allergies-reviewed and updated No Known Allergies  Social History   Socioeconomic History   Marital status: Married    Spouse name: Not on file   Number of children: Not on file   Years of education: Not on file   Highest education level: Not on file  Occupational History   Occupation: Transport planner  Tobacco Use   Smoking status: Never   Smokeless tobacco: Never  Vaping Use   Vaping status: Never Used  Substance and Sexual Activity   Alcohol use: Yes    Alcohol/week: 1.0 standard drink of alcohol    Types: 1 Standard drinks or equivalent per week   Drug use: Never   Sexual activity: Yes  Other Topics Concern   Not on file  Social History Narrative   Not on file   Social Determinants of Health   Financial Resource Strain: Low Risk  (04/11/2022)   Overall Financial Resource Strain (CARDIA)    Difficulty of Paying Living Expenses: Not hard at all  Food Insecurity: No Food Insecurity (04/11/2022)   Hunger Vital Sign    Worried About Running Out of Food in the Last Year: Never true    Ran Out of Food in the Last Year: Never true  Transportation Needs: No Transportation Needs (04/11/2022)   PRAPARE - Administrator, Civil Service (Medical): No    Lack of Transportation (Non-Medical): No  Physical Activity: Sufficiently Active (04/11/2022)   Exercise Vital Sign    Days of Exercise per Week: 7 days    Minutes of Exercise per Session: 60 min  Stress: No Stress Concern Present (04/11/2022)   [employer] of Occupational Health - Occupational Stress Questionnaire    Feeling of Stress : Not at all  Social Connections: Moderately Integrated (04/11/2022)   Social Connection and Isolation Panel [NHANES]    Frequency of Communication with Friends and Family: More than three times a week    Frequency of Social Gatherings with Friends and Family: More  than three times a week    Attends Religious Services: More than 4 times per year    Active Member of [employer] or Organizations: No    Attends Engineer, structural: Never    Marital Status: Married        Objective:  Physical Exam: BP 125/74   Pulse 69   Temp 97.7 F (36.5 C) (Temporal)   Ht 5\' 10"  (1.778 m)   Wt 182 lb 12.8 oz (82.9 kg)   SpO2 94%   BMI 26.23 kg/m   Body mass index is 26.23 kg/m. Wt Readings from Last 3 Encounters:  01/30/23 182 lb 12.8 oz (82.9 kg)  01/03/23 181 lb 6.1 oz (82.3 kg)  04/11/22 172 lb (78 kg)   Gen: NAD, resting comfortably HEENT: TMs normal bilaterally. OP clear. No thyromegaly noted.  CV: RRR with no murmurs appreciated Pulm: NWOB, CTAB with no crackles, wheezes, or rhonchi GI: Normal bowel sounds present. Soft, Nontender, Nondistended. MSK: no edema, cyanosis, or clubbing noted Skin: warm, dry Neuro: CN2-12 grossly intact. Strength 5/5 in upper and lower extremities. Reflexes symmetric and intact bilaterally.  Psych: Normal affect  and thought content     Alyah Boehning M. [name], MD 01/30/2023 8:47 AM

## 2023-01-30 NOTE — Assessment & Plan Note (Signed)
Stable without medications.  

## 2023-01-30 NOTE — Assessment & Plan Note (Signed)
Check A1c. 

## 2023-01-30 NOTE — Patient Instructions (Signed)
It was very nice to see you today!  We will check blood work today.  Keep working on diet and exercise.   Return in about 1 year (around 01/30/2024) for Annual Physical.   Take care, Dr [name]  PLEASE NOTE:  If you had any lab tests, please let us know if you have not heard back within a few days. You may see your results on mychart before we have a chance to review them but we will give you a call once they are reviewed by [place].   If we ordered any referrals today, please let us know if you have not heard from their office within the next week.   If you had any urgent prescriptions sent in today, please check with the pharmacy within an hour of our visit to make sure the prescription was transmitted appropriately.   Please try these tips to maintain a healthy lifestyle:  Eat at least 3 REAL meals and 1-2 snacks per day.  Aim for no more than 5 hours between eating.  If you eat breakfast, please do so within one hour of getting up.   Each meal should contain half fruits/vegetables, one quarter protein, and one quarter carbs (no bigger than a computer mouse)  Cut down on sweet beverages. This includes juice, soda, and sweet tea.   Drink at least 1 glass of water with each meal and aim for at least 8 glasses per day  Exercise at least 150 minutes every week.    Preventive Care 72 Years and Older, Male Preventive care refers to lifestyle choices and visits with your health care provider that can promote health and wellness. Preventive care visits are also called wellness exams. What can I expect for my preventive care visit? Counseling During your preventive care visit, your health care provider may ask about your: Medical history, including: Past medical problems. Family medical history. History of falls. Current health, including: Emotional well-being. Home life and relationship well-being. Sexual activity. Memory and ability to understand (cognition). Lifestyle,  including: Alcohol, nicotine or tobacco, and drug use. Access to firearms. Diet, exercise, and sleep habits. Work and work Astronomer. Sunscreen use. Safety issues such as seatbelt and bike helmet use. Physical exam Your health care provider will check your: Height and weight. These may be used to calculate your BMI (body mass index). BMI is a measurement that tells if you are at a healthy weight. Waist circumference. This measures the distance around your waistline. This measurement also tells if you are at a healthy weight and may help predict your risk of certain diseases, such as type 2 diabetes and high blood pressure. Heart rate and blood pressure. Body temperature. Skin for abnormal spots. What immunizations do I need?  Vaccines are usually given at various ages, according to a schedule. Your health care provider will recommend vaccines for you based on your age, medical history, and lifestyle or other factors, such as travel or where you work. What tests do I need? Screening Your health care provider may recommend screening tests for certain conditions. This may include: Lipid and cholesterol levels. Diabetes screening. This is done by checking your blood sugar (glucose) after you have not eaten for a while (fasting). Hepatitis C test. Hepatitis B test. HIV (human immunodeficiency virus) test. STI (sexually transmitted infection) testing, if you are at risk. Lung cancer screening. Colorectal cancer screening. Prostate cancer screening. Abdominal aortic aneurysm (AAA) screening. You may need this if you are a current or former smoker. Talk  with your health care provider about your test results, treatment options, and if necessary, the need for more tests. Follow these instructions at home: Eating and drinking  Eat a diet that includes fresh fruits and vegetables, whole grains, lean protein, and low-fat dairy products. Limit your intake of foods with high amounts of sugar,  saturated fats, and salt. Take vitamin and mineral supplements as recommended by your health care provider. Do not drink alcohol if your health care provider tells you not to drink. If you drink alcohol: Limit how much you have to 0-2 drinks a day. Know how much alcohol is in your drink. In the [place] one drink equals one 12 oz bottle of beer (355 mL), one 5 oz glass of wine (148 mL), or one 1 oz glass of hard liquor (44 mL). Lifestyle Brush your teeth every morning and night with fluoride toothpaste. Floss one time each day. Exercise for at least 30 minutes 5 or more days each week. Do not use any products that contain nicotine or tobacco. These products include cigarettes, chewing tobacco, and vaping devices, such as e-cigarettes. If you need help quitting, ask your health care provider. Do not use drugs. If you are sexually active, practice safe sex. Use a condom or other form of protection to prevent STIs. Take aspirin only as told by your health care provider. Make sure that you understand how much to take and what form to take. Work with your health care provider to find out whether it is safe and beneficial for you to take aspirin daily. Ask your health care provider if you need to take a cholesterol-lowering medicine (statin). Find healthy ways to manage stress, such as: Meditation, yoga, or listening to music. Journaling. Talking to a trusted person. Spending time with friends and family. Safety Always wear your seat belt while driving or riding in a vehicle. Do not drive: If you have been drinking alcohol. Do not ride with someone who has been drinking. When you are tired or distracted. While texting. If you have been using any mind-altering substances or drugs. Wear a helmet and other protective equipment during sports activities. If you have firearms in your house, make sure you follow all gun safety procedures. Minimize exposure to UV radiation to reduce your risk of skin  cancer. What's next? Visit your health care provider once a year for an annual wellness visit. Ask your health care provider how often you should have your eyes and teeth checked. Stay up to date on all vaccines. This information is not intended to replace advice given to you by your health care provider. Make sure you discuss any questions you have with your health care provider. Document Revised: 11/15/2020 Document Reviewed: 11/15/2020 Elsevier Patient Education  2024 [employer].

## 2023-01-30 NOTE — Assessment & Plan Note (Signed)
Check lipids. Discussed lifestyle modifications.  

## 2023-01-30 NOTE — Addendum Note (Signed)
Addended by: Dyann Kief on: 01/30/2023 09:51 AM   Modules accepted: Orders

## 2023-01-30 NOTE — Assessment & Plan Note (Signed)
Having a little bit more paresthesias in his right lower extremity.  Improving with conservative measures at home including exercise and anti-inflammatories.  He will continue using these for now however if symptoms progress he will follow-up with neurosurgery.  He had a surgery performed a few years ago.

## 2023-01-31 LAB — TSH: TSH: 1.4 u[IU]/mL (ref 0.35–5.50)

## 2023-01-31 LAB — PSA: PSA: 0.72 ng/mL (ref 0.10–4.00)

## 2023-01-31 NOTE — Progress Notes (Signed)
Great news! Labs are all normal.  Would like for him to keep up the great work.  He should keep working on diet and exercise we can recheck everything in a year.

## 2023-04-15 ENCOUNTER — Ambulatory Visit: Payer: PPO | Admitting: Orthopaedic Surgery

## 2023-04-15 ENCOUNTER — Other Ambulatory Visit: Payer: Self-pay

## 2023-04-15 DIAGNOSIS — M7121 Synovial cyst of popliteal space [Baker], right knee: Secondary | ICD-10-CM | POA: Diagnosis not present

## 2023-04-15 DIAGNOSIS — G8929 Other chronic pain: Secondary | ICD-10-CM

## 2023-04-15 NOTE — Progress Notes (Signed)
Office Visit Note   Patient: Jeremy Garcia           Date of Birth: July 25, 1955           MRN: [id number] Visit Date: 04/15/2023              Requested by: Ardith Dark, MD [address] [place],  [place] [address] PCP: Ardith Dark, MD   Assessment & Plan: Visit Diagnoses:  1. Baker's cyst of knee, right     Plan: Jeremy Garcia is a 67 year old gentleman with a symptomatic large Baker's cyst on the right knee.  He has a mild amount of arthritis in the knee.  Not really reporting any meniscal problems.  His main concern is symptomatic relief from the Baker's cyst therefore we will send him to Dr. Shon Baton for ultrasound-guided aspiration plus or minus cortisone injection.  Will see him back as needed.  Follow-Up Instructions: No follow-ups on file.   Orders:  No orders of the defined types were placed in this encounter.  No orders of the defined types were placed in this encounter.     Procedures: No procedures performed   Clinical Data: No additional findings.   Subjective: Chief Complaint  Patient presents with   Right Knee - Pain    HPI Jeremy Garcia is a 67 year old gentleman referral from PCP for symptomatic right Baker's cyst.  Saw Hazle Nordmann about 3 months ago for this and was placed on an anti-inflammatory.  This has not resolved the Baker's cyst.  Denies any mechanical symptoms.  He feels like NSAIDs have given him partial relief.  Review of Systems  Constitutional: Negative.   HENT: Negative.    Eyes: Negative.   Respiratory: Negative.    Cardiovascular: Negative.   Gastrointestinal: Negative.   Endocrine: Negative.   Genitourinary: Negative.   Skin: Negative.   Allergic/Immunologic: Negative.   Neurological: Negative.   Hematological: Negative.   Psychiatric/Behavioral: Negative.    All other systems reviewed and are negative.    Objective: Vital Signs: There were no vitals taken for this visit.  Physical Exam Vitals and nursing note  reviewed.  Constitutional:      Appearance: He is well-developed.  HENT:     Head: Normocephalic and atraumatic.  Eyes:     Pupils: Pupils are equal, round, and reactive to light.  Pulmonary:     Effort: Pulmonary effort is normal.  Abdominal:     Palpations: Abdomen is soft.  Musculoskeletal:        General: Normal range of motion.     Cervical back: Neck supple.  Skin:    General: Skin is warm.  Neurological:     Mental Status: He is alert and oriented to person, place, and time.  Psychiatric:        Behavior: Behavior normal.        Thought Content: Thought content normal.        Judgment: Judgment normal.     Ortho Exam Exam of the right knee shows a large Baker's cyst in the popliteal fossa.  No joint line tenderness.  Remaining portions of the exam are normal. Specialty Comments:  No specialty comments available.  Imaging: No results found.   PMFS History: Patient Active Problem List   Diagnosis Date Noted   Hyperglycemia 01/23/2021   Dyslipidemia 12/09/2019   GERD (gastroesophageal reflux disease) 12/08/2019   Tremor 12/08/2019   History of pulmonary embolism 09/14/2019   Spondylolisthesis of lumbar region 08/19/2019   Erectile  dysfunction 12/01/2018   Allergic rhinitis 12/01/2018   Past Medical History:  Diagnosis Date   Anal fissure    GERD (gastroesophageal reflux disease)    Unspecified personal history presenting hazards to health     Family History  Problem Relation Age of Onset   Hypertension Brother    Prostate cancer Father        24s   Coronary artery disease Mother     Past Surgical History:  Procedure Laterality Date   ANTERIOR LAT LUMBAR FUSION N/A 08/19/2019   Procedure: Lumbar three- Anterolateral lumbar interbody fusion with lateral plate fixation;  Surgeon: Barnett Abu, MD;  Location: [facility] OR;  Service: Neurosurgery;  Laterality: N/A;   BACK SURGERY     Due to sciatica   KNEE CARTILAGE SURGERY     Arthroscopy   Social History    Occupational History   Occupation: Transport planner  Tobacco Use   Smoking status: Never   Smokeless tobacco: Never  Vaping Use   Vaping status: Never Used  Substance and Sexual Activity   Alcohol use: Yes    Alcohol/week: 1.0 standard drink of alcohol    Types: 1 Standard drinks or equivalent per week   Drug use: Never   Sexual activity: Yes

## 2023-04-17 ENCOUNTER — Telehealth: Payer: Self-pay | Admitting: Sports Medicine

## 2023-04-17 NOTE — Telephone Encounter (Signed)
Patient called and ask if you could call him. He has a few questions about the cycst being removed before he comes. CB#[phone]

## 2023-04-23 ENCOUNTER — Encounter: Payer: Self-pay | Admitting: Sports Medicine

## 2023-04-23 ENCOUNTER — Other Ambulatory Visit: Payer: Self-pay

## 2023-04-23 ENCOUNTER — Ambulatory Visit: Payer: PPO | Admitting: Sports Medicine

## 2023-04-23 DIAGNOSIS — M7121 Synovial cyst of popliteal space [Baker], right knee: Secondary | ICD-10-CM

## 2023-04-23 DIAGNOSIS — M1711 Unilateral primary osteoarthritis, right knee: Secondary | ICD-10-CM | POA: Diagnosis not present

## 2023-04-23 MED ORDER — METHYLPREDNISOLONE ACETATE 40 MG/ML IJ SUSP
40.0000 mg | INTRAMUSCULAR | Status: AC | PRN
Start: 2023-04-23 — End: 2023-04-23
  Administered 2023-04-23: 40 mg via INTRA_ARTICULAR

## 2023-04-23 MED ORDER — BUPIVACAINE HCL 0.25 % IJ SOLN
1.0000 mL | INTRAMUSCULAR | Status: AC | PRN
Start: 2023-04-23 — End: 2023-04-23
  Administered 2023-04-23: 1 mL via INTRA_ARTICULAR

## 2023-04-23 MED ORDER — LIDOCAINE HCL 1 % IJ SOLN
1.0000 mL | INTRAMUSCULAR | Status: AC | PRN
Start: 2023-04-23 — End: 2023-04-23
  Administered 2023-04-23: 1 mL

## 2023-04-23 NOTE — Progress Notes (Signed)
Jeremy Garcia - 67 y.o. male MRN [id number]  Date of birth: 1955-11-12  Office Visit Note: Visit Date: 04/23/2023 PCP: Ardith Dark, MD Referred by: Ardith Dark, MD  Subjective: Chief Complaint  Patient presents with   Right Knee - Pain    Aspiration   HPI: Jeremy Garcia is a pleasant 67 y.o. male who presents today for evaluation of right knee pain with posterior swelling.  Jeremy Garcia presents with chronic swelling and pain in the posterior aspect of the knee.  He had an episode months ago where he slid down in embankment and had the right leg tucked underneath him.  Since then he has had swelling in the Part of the knee, which was presumed to be a Baker's cyst.  It fluctuates in size.  Pertinent ROS were reviewed with the patient and found to be negative unless otherwise specified above in HPI.   Assessment & Plan: Visit Diagnoses:  1. Baker's cyst of knee, right   2. Unilateral primary osteoarthritis, right knee    Plan: Discussed with Jeremy Garcia that based on his ultrasound and exam he does have a large Baker's cyst that is symptomatic from his right knee.  Through shared decision-making, we did proceed with ultrasound-guided aspiration and subsequent injection.  We did aspirate approximately 40 cc of fluid off the knee which should certainly help his motion and pain.  We did discuss the nature of Baker's cyst and the possibility of this returning.  We did cover the knee with an Ace wrap for compression, I did recommend a neoprene compression sleeve to help support the knee.  He may use his meloxicam as needed.  Follow-up with me as needed.  Follow-up: Return if symptoms worsen or fail to improve.   Meds & Orders: No orders of the defined types were placed in this encounter.   Orders Placed This Encounter  Procedures   Large Joint Inj   US Guided Needle Placement - No Linked Charges     Procedures: Large Joint Inj: R knee on 04/23/2023 9:41 AM Indications: joint swelling  and pain Details: 18 G 1.5 in needle, ultrasound-guided superolateral approach Medications: 1 mL lidocaine 1 %; 1 mL bupivacaine 0.25 %; 40 mg methylPREDNISolone acetate 40 MG/ML Aspirate: clear and yellow; sent for lab analysis Outcome: tolerated well, no immediate complications  After discussion on risks/benefits/indications, informed verbal consent was obtained and a timeout was performed. The patient was lying prone on exam table and the patient's knee was prepped with Chloraprep and alcohol swabs. Utilizing ultrasound-guidance with the probe in a longitudinal position, the Baker's cyst was identified and the surrounding soft tissue area was anesthesized first with a 27G, 1.5" needle with approximately 3 cc of lidocaine 1% using sterile technique. Following this, using ultrasound guidance via an in-plane approach, an 18G, 1.5" needle was directed into cyst and approximately 40 cc's of clear, yellow fluid was aspirated from the cyst and posterior knee joint. Utilizing the same portal and ultrasound-guidance, a 1:1:1 lidocaine:bupivicaine:methylprednisolone injectate was administered into the aspirated cyst space. Patient tolerated the procedure well without immediate complications.  Procedure, treatment alternatives, risks and benefits explained, specific risks discussed. Consent was given by the patient. Immediately prior to procedure a time out was called to verify the correct patient, procedure, equipment, support staff and site/side marked as required. Patient was prepped and draped in the usual sterile fashion.              Clinical History: No specialty comments  available.  He reports that he has never smoked. He has never used smokeless tobacco.  Recent Labs    01/30/23 0847 01/30/23 0951  HGBA1C 5.5  --   LABURIC  --  6.3    Objective:    Physical Exam  Gen: Well-appearing, in no acute distress; non-toxic CV: Regular Rate. Well-perfused. Warm.  Resp: Breathing unlabored  on room air; no wheezing. Psych: Fluid speech in conversation; appropriate affect; normal thought process Neuro: Sensation intact throughout. No gross coordination deficits.   Ortho Exam - Right knee: There is a large swelling in the posterior aspect of the popliteal fossa.  There is pain with endrange flexion.  No redness or warmth.  Imaging:  DG Knee Complete 4 Views Right CLINICAL DATA:  Right knee pain  EXAM: RIGHT KNEE - COMPLETE 4+ VIEW  COMPARISON:  None Available.  FINDINGS: Normal alignment. No acute fracture or dislocation. Moderate medial compartment degenerative arthritis. Minimal degenerative changes noted within the lateral patellofemoral compartments with tiny osteophyte formation. No effusion. Mild prepatellar soft tissue swelling.  IMPRESSION: 1. Moderate medial compartment degenerative arthritis.  Electronically Signed   By: Helyn Numbers M.D.   On: 01/09/2023 23:24  Past Medical/Family/Surgical/Social History: Medications & Allergies reviewed per EMR, new medications updated. Patient Active Problem List   Diagnosis Date Noted   Hyperglycemia 01/23/2021   Dyslipidemia 12/09/2019   GERD (gastroesophageal reflux disease) 12/08/2019   Tremor 12/08/2019   History of pulmonary embolism 09/14/2019   Spondylolisthesis of lumbar region 08/19/2019   Erectile dysfunction 12/01/2018   Allergic rhinitis 12/01/2018   Past Medical History:  Diagnosis Date   Anal fissure    GERD (gastroesophageal reflux disease)    Unspecified personal history presenting hazards to health    Family History  Problem Relation Age of Onset   Hypertension Brother    Prostate cancer Father        71s   Coronary artery disease Mother    Past Surgical History:  Procedure Laterality Date   ANTERIOR LAT LUMBAR FUSION N/A 08/19/2019   Procedure: Lumbar three- Anterolateral lumbar interbody fusion with lateral plate fixation;  Surgeon: Barnett Abu, MD;  Location: [facility] OR;  Service:  Neurosurgery;  Laterality: N/A;   BACK SURGERY     Due to sciatica   KNEE CARTILAGE SURGERY     Arthroscopy   Social History   Occupational History   Occupation: Transport planner  Tobacco Use   Smoking status: Never   Smokeless tobacco: Never  Vaping Use   Vaping status: Never Used  Substance and Sexual Activity   Alcohol use: Yes    Alcohol/week: 1.0 standard drink of alcohol    Types: 1 Standard drinks or equivalent per week   Drug use: Never   Sexual activity: Yes

## 2023-05-05 ENCOUNTER — Telehealth: Payer: Self-pay | Admitting: Family Medicine

## 2023-05-05 NOTE — Telephone Encounter (Signed)
Spoke with pt & states he followed nurse's at home instructions. States it is much better but will call back if it gets worse.   Patient Name First: Jeremy Garcia Last: Stimpson Gender: Male DOB: 01-19-1956 Age: 67 Y 5 M 30 D Return Phone Number: [phone] (Primary) Address: City/ State/ Zip: [place]  [address] Client [facility] - Human resources [facility] Type Call Who Is Calling Patient / Member / Family / Caregiver Call Type Triage / Clinical Relationship To Patient Self Return Phone Number [phone] (Primary) Chief Complaint Post-op Incision Symptoms Reason for Call Symptomatic / Request for Health Information Initial Comment Caller states that she he had a cyst removed last week through aspiration. The back of his knee looks as if it infected. It is red and has puss on it . He sees Dr. [name]. GOTO Facility Not Listed [facility] Translation No Nurse Assessment Nurse: Cheree Ditto, RN, Jill Side Date/Time Lamount Cohen Time): 05/02/2023 10:06:08 AM Confirm and document reason for call. If symptomatic, describe symptoms. ---Caller states that he has a cyst removed from the back of the right knee last week. He noticed redness and purulant drainage yesterday. He is not having increased pain but it is tender if you press on it. He is not any antibiotics. No fever noted. Does the patient have any new or worsening symptoms? ---Yes Will a triage be completed? ---Yes Related visit to physician within the last 2 weeks? ---Yes Does the PT have any chronic conditions? (i.e. diabetes, asthma, this includes High risk factors for pregnancy, etc.) ---No Is this a behavioral health or substance abuse call? ---No Guidelines Guideline Title Affirmed Question Affirmed Notes Nurse Date/Time (Eastern Time) Wound Infection Suspected [1] Looks infected (e.g., spreading redness, pus) AND [2] no fever Cheree Ditto,  RN, Colleen 05/02/2023 10:10:23 AM Disp. Time Lamount Cohen Time) Disposition Final User 05/02/2023 10:14:59 AM See PCP within 24 Hours Yes Cheree Ditto RN, Marion Final Disposition 05/02/2023 10:14:59 AM See PCP within 24 Hours Yes Cheree Ditto, RN, Jill Side Caller Disagree/Comply Comply Caller Understands Yes PreDisposition Home Care Care Advice Given Per Guideline SEE PCP WITHIN 24 HOURS: * IF OFFICE WILL BE CLOSED: You need to be seen within the next 24 hours. A clinic or an urgent care center is often a good source of care if your doctor's office is closed or you can't get an appointment. ANTIBIOTIC OINTMENT: * Put a small amount of antibiotic ointment on the wound once a day for 3 days. * You can get this over-the-counter (OTC) at a drugstore. * Use Bacitracin ointment (OTC in [place] or Polysporin ointment (OTC in [place]) or one that you already have. * Read the package instructions on all medicines that you use. CALL BACK IF: * Fever occurs * You become worse CARE ADVICE per Wound Infection Suspected (Adult) guideline. Referrals GO TO FACILITY OTHER - SPECIFY

## 2023-05-06 NOTE — Telephone Encounter (Signed)
See note

## 2023-05-06 NOTE — Telephone Encounter (Signed)
Noted. Recommend he follow up with orthopedics.  Katina Degree. [name], MD 05/06/2023 1:19 PM

## 2023-05-16 DIAGNOSIS — H11422 Conjunctival edema, left eye: Secondary | ICD-10-CM | POA: Diagnosis not present

## 2023-05-16 DIAGNOSIS — H00025 Hordeolum internum left lower eyelid: Secondary | ICD-10-CM | POA: Diagnosis not present

## 2023-06-25 ENCOUNTER — Ambulatory Visit: Payer: PPO

## 2023-06-25 VITALS — Wt 182.0 lb

## 2023-06-25 DIAGNOSIS — Z0000 Encounter for general adult medical examination without abnormal findings: Secondary | ICD-10-CM

## 2023-06-25 NOTE — Patient Instructions (Signed)
Jeremy Garcia , Thank you for taking time to come for your Medicare Wellness Visit. I appreciate your ongoing commitment to your health goals. Please review the following plan we discussed and let me know if I can assist you in the future.   Referrals/Orders/Follow-Ups/Clinician Recommendations: maintain  health and activity   This is a list of the screening recommended for you and due dates:  Health Maintenance  Topic Date Due   Zoster (Shingles) Vaccine (1 of 2) Never done   Flu Shot  09/01/2023*   Medicare Annual Wellness Visit  06/24/2024   Colon Cancer Screening  12/28/2025   DTaP/Tdap/Td vaccine (3 - Td or Tdap) 11/30/2028   Pneumonia Vaccine  Completed   COVID-19 Vaccine  Completed   Hepatitis C Screening  Completed   HPV Vaccine  Aged Out  *Topic was postponed. The date shown is not the original due date.    Advanced directives: (Copy Requested) Please bring a copy of your health care power of attorney and living will to the office to be added to your chart at your convenience.  Next Medicare Annual Wellness Visit scheduled for next year: Yes

## 2023-06-25 NOTE — Progress Notes (Signed)
Subjective:   Jeremy Garcia is a 68 y.o. male who presents for Medicare Annual/Subsequent preventive examination.  Visit Complete: Virtual I connected with  Jeremy Garcia on 06/25/23 by a video and audio enabled telemedicine application and verified that I am speaking with the correct person using two identifiers.  Patient Location: Home  Provider Location: Home Office  I discussed the limitations of evaluation and management by telemedicine. The patient expressed understanding and agreed to proceed.  Vital Signs: Because this visit was a virtual/telehealth visit, some criteria may be missing or patient reported. Any vitals not documented were not able to be obtained and vitals that have been documented are patient reported.  Patient Medicare AWV questionnaire was completed by the patient on 06/25/23; I have confirmed that all information answered by patient is correct and no changes since this date.        Objective:    Today's Vitals   06/25/23 0949  Weight: 182 lb (82.6 kg)   Body mass index is 26.11 kg/m.     06/25/2023    9:55 AM 04/11/2022   11:06 AM 08/19/2019    6:06 AM 08/16/2019   11:27 AM  Advanced Directives  Does Patient Have a Medical Advance Directive? Yes Yes Yes Yes  Type of Estate agent of [place];Living will Healthcare Power of [place];Living will Healthcare Power of [place];Living will Healthcare Power of [place];Living will  Does patient want to make changes to medical advance directive?   No - Patient declined No - Patient declined  Copy of Healthcare Power of Attorney in Chart? No - copy requested No - copy requested  No - copy requested    Current Medications (verified) Outpatient Encounter Medications as of 06/25/2023  Medication Sig   loratadine (CLARITIN) 10 MG tablet Take 10 mg by mouth daily.   No facility-administered encounter medications on file as of 06/25/2023.    Allergies (verified) Patient has no known  allergies.   History: Past Medical History:  Diagnosis Date   Anal fissure    GERD (gastroesophageal reflux disease)    Unspecified personal history presenting hazards to health    Past Surgical History:  Procedure Laterality Date   ANTERIOR LAT LUMBAR FUSION N/A 08/19/2019   Procedure: Lumbar three- Anterolateral lumbar interbody fusion with lateral plate fixation;  Surgeon: Barnett Abu, MD;  Location: [facility] OR;  Service: Neurosurgery;  Laterality: N/A;   BACK SURGERY     Due to sciatica   KNEE CARTILAGE SURGERY     Arthroscopy   Family History  Problem Relation Age of Onset   Hypertension Brother    Prostate cancer Father        16s   Coronary artery disease Mother    Social History   Socioeconomic History   Marital status: Married    Spouse name: Not on file   Number of children: Not on file   Years of education: Not on file   Highest education level: Master's degree (e.g., MA, MS, MEng, MEd, MSW, MBA)  Occupational History   Occupation: Transport planner  Tobacco Use   Smoking status: Never   Smokeless tobacco: Never  Vaping Use   Vaping status: Never Used  Substance and Sexual Activity   Alcohol use: Yes    Alcohol/week: 1.0 standard drink of alcohol    Types: 1 Standard drinks or equivalent per week   Drug use: Never   Sexual activity: Yes  Other Topics Concern   Not on file  Social History Narrative   Not on file   Social Drivers of Health   Financial Resource Strain: Low Risk  (06/25/2023)   Overall Financial Resource Strain (CARDIA)    Difficulty of Paying Living Expenses: Not hard at all  Food Insecurity: No Food Insecurity (06/25/2023)   Hunger Vital Sign    Worried About Running Out of Food in the Last Year: Never true    Ran Out of Food in the Last Year: Never true  Transportation Needs: No Transportation Needs (06/25/2023)   PRAPARE - Administrator, Civil Service (Medical): No    Lack of Transportation (Non-Medical): No  Physical  Activity: Sufficiently Active (06/25/2023)   Exercise Vital Sign    Days of Exercise per Week: 7 days    Minutes of Exercise per Session: 30 min  Stress: No Stress Concern Present (06/25/2023)   [employer] of Occupational Health - Occupational Stress Questionnaire    Feeling of Stress : Not at all  Social Connections: Moderately Integrated (06/25/2023)   Social Connection and Isolation Panel [NHANES]    Frequency of Communication with Friends and Family: Once a week    Frequency of Social Gatherings with Friends and Family: More than three times a week    Attends Religious Services: More than 4 times per year    Active Member of [employer] or Organizations: No    Attends Engineer, structural: Not on file    Marital Status: Married    Tobacco Counseling Counseling given: Not Answered   Clinical Intake:  Pre-visit preparation completed: Yes  Pain : No/denies pain     BMI - recorded: 26.11 Nutritional Status: BMI 25 -29 Overweight Nutritional Risks: None Diabetes: No  How often do you need to have someone help you when you read instructions, pamphlets, or other written materials from your doctor or pharmacy?: 1 - Never  Interpreter Needed?: No  Information entered by :: Lanier Ensign, LPN   Activities of Daily Living     No data to display          Patient Care Team: Ardith Dark, MD as PCP - General (Family Medicine)  Indicate any recent Medical Services you may have received from other than [facility] providers in the past year (date may be approximate).     Assessment:   This is a routine wellness examination for [place].  Hearing/Vision screen Hearing Screening - Comments:: Pt denies any hearing issues  Vision Screening - Comments:: Pt follows up with dr Elicia Lamp for annual eye exams    Goals Addressed             This Visit's Progress    Patient Stated       Maintain health and activity        Depression Screen    06/25/2023    9:55  AM 01/30/2023    8:02 AM 04/11/2022   11:04 AM 01/24/2022    8:03 AM 01/23/2021    9:03 AM 03/14/2020    2:21 PM 12/01/2018    8:14 AM  PHQ 2/9 Scores  PHQ - 2 Score 0 0 0 0 0 0 0  PHQ- 9 Score      0     Fall Risk    06/25/2023    9:57 AM 01/30/2023    8:02 AM 04/11/2022   11:06 AM 01/24/2022    8:03 AM 01/23/2021    9:03 AM  Fall Risk   Falls in the past year? 1  0 0 0 0  Number falls in past yr: 1 0 0 0 0  Injury with Fall? 1 0 0 0 0  Comment bakers cyst on righ knee      Risk for fall due to : History of fall(s) No Fall Risks Impaired vision No Fall Risks No Fall Risks  Follow up Falls prevention discussed  Falls prevention discussed      MEDICARE RISK AT HOME: Medicare Risk at Home Any stairs in or around the home?: Yes If so, are there any without handrails?: No Home free of loose throw rugs in walkways, pet beds, electrical cords, etc?: Yes Adequate lighting in your home to reduce risk of falls?: Yes Life alert?: No Use of a cane, walker or w/c?: No Grab bars in the bathroom?: No Shower chair or bench in shower?: Yes Elevated toilet seat or a handicapped toilet?: No  TIMED UP AND GO:  Was the test performed?  No    Cognitive Function:        06/25/2023    9:58 AM 04/11/2022   11:07 AM  6CIT Screen  What Year? 0 points 0 points  What month? 0 points 0 points  What time? 0 points 0 points  Count back from 20 0 points 0 points  Months in reverse 0 points 0 points  Repeat phrase 0 points 0 points  Total Score 0 points 0 points    Immunizations Immunization History  Administered Date(s) Administered   Fluad Quad(high Dose 65+) 02/14/2022   Hep A / Hep B 02/28/2006, 03/31/2006, 10/28/2006   IPV 04/10/2001   Influenza, High Dose Seasonal PF 04/03/2021   Influenza,inj,Quad PF,6+ Mos 02/28/2018, 03/03/2019, 02/10/2020   Influenza-Unspecified 04/13/2012, 04/28/2013, 03/23/2014, 02/10/2015   PFIZER Comirnaty(Gray Top)Covid-19 Tri-Sucrose Vaccine 10/28/2020    PFIZER(Purple Top)SARS-COV-2 Vaccination 08/28/2019, 09/18/2019, 04/25/2020   PNEUMOCOCCAL CONJUGATE-20 01/23/2021   Tdap 02/28/2006, 12/01/2018   Typhoid Inactivated 02/28/2006   Typhoid Live 02/28/2006    TDAP status: Up to date  Flu Vaccine status: Due, Education has been provided regarding the importance of this vaccine. Advised may receive this vaccine at local pharmacy or Health Dept. Aware to provide a copy of the vaccination record if obtained from local pharmacy or Health Dept. Verbalized acceptance and understanding.  Pneumococcal vaccine status: Up to date  Covid-19 vaccine status: Completed vaccines  Qualifies for Shingles Vaccine? Yes   Zostavax completed No   Shingrix Completed?: No.    Education has been provided regarding the importance of this vaccine. Patient has been advised to call insurance company to determine out of pocket expense if they have not yet received this vaccine. Advised may also receive vaccine at local pharmacy or Health Dept. Verbalized acceptance and understanding.  Screening Tests Health Maintenance  Topic Date Due   Zoster Vaccines- Shingrix (1 of 2) Never done   INFLUENZA VACCINE  09/01/2023 (Originally 01/02/2023)   Medicare Annual Wellness (AWV)  06/24/2024   Colonoscopy  12/28/2025   DTaP/Tdap/Td (3 - Td or Tdap) 11/30/2028   Pneumonia Vaccine 56+ Years old  Completed   COVID-19 Vaccine  Completed   Hepatitis C Screening  Completed   HPV VACCINES  Aged Out    Health Maintenance  Health Maintenance Due  Topic Date Due   Zoster Vaccines- Shingrix (1 of 2) Never done    Colorectal cancer screening: Type of screening: Colonoscopy. Completed 12/29/15. Repeat every 10 years    Additional Screening:  Hepatitis C Screening:  Completed 12/01/18  Vision Screening: Recommended  annual ophthalmology exams for early detection of glaucoma and other disorders of the eye. Is the patient up to date with their annual eye exam?  Yes  Who is the  provider or what is the name of the office in which the patient attends annual eye exams? Dr Elicia Lamp  If pt is not established with a provider, would they like to be referred to a provider to establish care? No .   Dental Screening: Recommended annual dental exams for proper oral hygiene  Community Resource Referral / Chronic Care Management: CRR required this visit?  No   CCM required this visit?  No     Plan:     I have personally reviewed and noted the following in the patient's chart:   Medical and social history Use of alcohol, tobacco or illicit drugs  Current medications and supplements including opioid prescriptions. Patient is not currently taking opioid prescriptions. Functional ability and status Nutritional status Physical activity Advanced directives List of other physicians Hospitalizations, surgeries, and ER visits in previous 12 months Vitals Screenings to include cognitive, depression, and falls Referrals and appointments  In addition, I have reviewed and discussed with patient certain preventive protocols, quality metrics, and best practice recommendations. A written personalized care plan for preventive services as well as general preventive health recommendations were provided to patient.     Marzella Schlein, LPN   07/17/863   After Visit Summary: (MyChart) Due to this being a telephonic visit, the after visit summary with patients personalized plan was offered to patient via MyChart   Nurse Notes: none

## 2023-06-30 NOTE — Progress Notes (Cosign Needed Addendum)
Subjective:   Jeremy Garcia is a 68 y.o. male who presents for Medicare Annual/Subsequent preventive examination.  Visit Complete: Virtual I connected with  [name] on 06/25/23 by a video and audio enabled telemedicine application and verified that I am speaking with the correct person using two identifiers.  Patient Location: Home  Provider Location: Home Office  I discussed the limitations of evaluation and management by telemedicine. The patient expressed understanding and agreed to proceed.  Vital Signs: Because this visit was a virtual/telehealth visit, some criteria may be missing or patient reported. Any vitals not documented were not able to be obtained and vitals that have been documented are patient reported.  Patient Medicare AWV questionnaire was completed by the patient on 06/25/23; I have confirmed that all information answered by patient is correct and no changes since this date.  Cardiac Risk Factors include: advanced age (>63men, >74 women);dyslipidemia     Objective:    Today's Vitals   06/25/23 0949  Weight: 182 lb (82.6 kg)   Body mass index is 26.11 kg/m.     06/25/2023    9:55 AM 04/11/2022   11:06 AM 08/19/2019    6:06 AM 08/16/2019   11:27 AM  Advanced Directives  Does Patient Have a Medical Advance Directive? Yes Yes Yes Yes  Type of Estate agent of [place];Living will Healthcare Power of [place];Living will Healthcare Power of [place];Living will Healthcare Power of [place];Living will  Does patient want to make changes to medical advance directive?   No - Patient declined No - Patient declined  Copy of Healthcare Power of Attorney in Chart? No - copy requested No - copy requested  No - copy requested    Current Medications (verified) Outpatient Encounter Medications as of 06/25/2023  Medication Sig   loratadine (CLARITIN) 10 MG tablet Take 10 mg by mouth daily.   No facility-administered encounter medications  on file as of 06/25/2023.    Allergies (verified) Patient has no known allergies.   History: Past Medical History:  Diagnosis Date   Anal fissure    GERD (gastroesophageal reflux disease)    Unspecified personal history presenting hazards to health    Past Surgical History:  Procedure Laterality Date   ANTERIOR LAT LUMBAR FUSION N/A 08/19/2019   Procedure: Lumbar three- Anterolateral lumbar interbody fusion with lateral plate fixation;  Surgeon: Barnett Abu, MD;  Location: [facility] OR;  Service: Neurosurgery;  Laterality: N/A;   BACK SURGERY     Due to sciatica   KNEE CARTILAGE SURGERY     Arthroscopy   Family History  Problem Relation Age of Onset   Hypertension Brother    Prostate cancer Father        71s   Coronary artery disease Mother    Social History   Socioeconomic History   Marital status: Married    Spouse name: Not on file   Number of children: Not on file   Years of education: Not on file   Highest education level: Master's degree (e.g., MA, MS, MEng, MEd, MSW, MBA)  Occupational History   Occupation: Transport planner  Tobacco Use   Smoking status: Never   Smokeless tobacco: Never  Vaping Use   Vaping status: Never Used  Substance and Sexual Activity   Alcohol use: Yes    Alcohol/week: 1.0 standard drink of alcohol    Types: 1 Standard drinks or equivalent per week   Drug use: Never   Sexual activity: Yes  Other Topics  Concern   Not on file  Social History Narrative   Not on file   Social Drivers of Health   Financial Resource Strain: Low Risk  (06/25/2023)   Overall Financial Resource Strain (CARDIA)    Difficulty of Paying Living Expenses: Not hard at all  Food Insecurity: No Food Insecurity (06/25/2023)   Hunger Vital Sign    Worried About Running Out of Food in the Last Year: Never true    Ran Out of Food in the Last Year: Never true  Transportation Needs: No Transportation Needs (06/25/2023)   PRAPARE - Administrator, Civil Service  (Medical): No    Lack of Transportation (Non-Medical): No  Physical Activity: Sufficiently Active (06/25/2023)   Exercise Vital Sign    Days of Exercise per Week: 7 days    Minutes of Exercise per Session: 30 min  Stress: No Stress Concern Present (06/25/2023)   [employer] of Occupational Health - Occupational Stress Questionnaire    Feeling of Stress : Not at all  Social Connections: Moderately Integrated (06/25/2023)   Social Connection and Isolation Panel [NHANES]    Frequency of Communication with Friends and Family: Once a week    Frequency of Social Gatherings with Friends and Family: More than three times a week    Attends Religious Services: More than 4 times per year    Active Member of [employer] or Organizations: No    Attends Engineer, structural: Not on file    Marital Status: Married    Tobacco Counseling Counseling given: Not Answered   Clinical Intake:  Pre-visit preparation completed: Yes  Pain : No/denies pain     BMI - recorded: 26.11 Nutritional Status: BMI 25 -29 Overweight Nutritional Risks: None Diabetes: No  How often do you need to have someone help you when you read instructions, pamphlets, or other written materials from your doctor or pharmacy?: 1 - Never  Interpreter Needed?: No  Information entered by :: Lanier Ensign, LPN   Activities of Daily Living    06/25/2023    7:58 AM  In your present state of health, do you have any difficulty performing the following activities:  Hearing? 0  Vision? 0  Difficulty concentrating or making decisions? 0  Walking or climbing stairs? 0  Dressing or bathing? 0  Doing errands, shopping? 0  Preparing Food and eating ? N  Using the Toilet? N  In the past six months, have you accidently leaked urine? N  Do you have problems with loss of bowel control? N  Managing your Medications? N  Managing your Finances? N  Housekeeping or managing your Housekeeping? N    Patient Care  Team: Ardith Dark, MD as PCP - General (Family Medicine)  Indicate any recent Medical Services you may have received from other than [facility] providers in the past year (date may be approximate).     Assessment:   This is a routine wellness examination for [name].  Hearing/Vision screen Hearing Screening - Comments:: Pt denies any hearing issues  Vision Screening - Comments:: Pt follows up with dr Elicia Lamp for annual eye exams    Goals Addressed             This Visit's Progress    Patient Stated       Maintain health and activity        Depression Screen    06/25/2023    9:55 AM 01/30/2023    8:02 AM 04/11/2022   11:04 AM  01/24/2022    8:03 AM 01/23/2021    9:03 AM 03/14/2020    2:21 PM 12/01/2018    8:14 AM  PHQ 2/9 Scores  PHQ - 2 Score 0 0 0 0 0 0 0  PHQ- 9 Score      0     Fall Risk    06/25/2023    9:57 AM 01/30/2023    8:02 AM 04/11/2022   11:06 AM 01/24/2022    8:03 AM 01/23/2021    9:03 AM  Fall Risk   Falls in the past year? 1 0 0 0 0  Number falls in past yr: 1 0 0 0 0  Injury with Fall? 1 0 0 0 0  Comment bakers cyst on righ knee      Risk for fall due to : History of fall(s) No Fall Risks Impaired vision No Fall Risks No Fall Risks  Follow up Falls prevention discussed  Falls prevention discussed      MEDICARE RISK AT HOME: Medicare Risk at Home Any stairs in or around the home?: Yes If so, are there any without handrails?: No Home free of loose throw rugs in walkways, pet beds, electrical cords, etc?: Yes Adequate lighting in your home to reduce risk of falls?: Yes Life alert?: No Use of a cane, walker or w/c?: No Grab bars in the bathroom?: No Shower chair or bench in shower?: Yes Elevated toilet seat or a handicapped toilet?: No  TIMED UP AND GO:  Was the test performed?  No    Cognitive Function:        06/25/2023    9:58 AM 04/11/2022   11:07 AM  6CIT Screen  What Year? 0 points 0 points  What month? 0 points 0 points  What time?  0 points 0 points  Count back from 20 0 points 0 points  Months in reverse 0 points 0 points  Repeat phrase 0 points 0 points  Total Score 0 points 0 points    Immunizations Immunization History  Administered Date(s) Administered   Fluad Quad(high Dose 65+) 02/14/2022   Hep A / Hep B 02/28/2006, 03/31/2006, 10/28/2006   IPV 04/10/2001   Influenza, High Dose Seasonal PF 04/03/2021   Influenza,inj,Quad PF,6+ Mos 02/28/2018, 03/03/2019, 02/10/2020   Influenza-Unspecified 04/13/2012, 04/28/2013, 03/23/2014, 02/10/2015   PFIZER Comirnaty(Gray Top)Covid-19 Tri-Sucrose Vaccine 10/28/2020   PFIZER(Purple Top)SARS-COV-2 Vaccination 08/28/2019, 09/18/2019, 04/25/2020   PNEUMOCOCCAL CONJUGATE-20 01/23/2021   Tdap 02/28/2006, 12/01/2018   Typhoid Inactivated 02/28/2006   Typhoid Live 02/28/2006    TDAP status: Up to date  Flu Vaccine status: Due, Education has been provided regarding the importance of this vaccine. Advised may receive this vaccine at local pharmacy or Health Dept. Aware to provide a copy of the vaccination record if obtained from local pharmacy or Health Dept. Verbalized acceptance and understanding.  Pneumococcal vaccine status: Up to date  Covid-19 vaccine status: Completed vaccines  Qualifies for Shingles Vaccine? Yes   Zostavax completed No   Shingrix Completed?: No.    Education has been provided regarding the importance of this vaccine. Patient has been advised to call insurance company to determine out of pocket expense if they have not yet received this vaccine. Advised may also receive vaccine at local pharmacy or Health Dept. Verbalized acceptance and understanding.  Screening Tests Health Maintenance  Topic Date Due   Zoster Vaccines- Shingrix (1 of 2) Never done   INFLUENZA VACCINE  09/01/2023 (Originally 01/02/2023)   Medicare Annual Wellness (AWV)  06/24/2024   Colonoscopy  12/28/2025   DTaP/Tdap/Td (3 - Td or Tdap) 11/30/2028   Pneumonia Vaccine 65+ Years  old  Completed   COVID-19 Vaccine  Completed   Hepatitis C Screening  Completed   HPV VACCINES  Aged Out    Health Maintenance  Health Maintenance Due  Topic Date Due   Zoster Vaccines- Shingrix (1 of 2) Never done    Colorectal cancer screening: Type of screening: Colonoscopy. Completed 12/29/15. Repeat every 10 years    Additional Screening:  Hepatitis C Screening:  Completed 12/01/18  Vision Screening: Recommended annual ophthalmology exams for early detection of glaucoma and other disorders of the eye. Is the patient up to date with their annual eye exam?  Yes  Who is the provider or what is the name of the office in which the patient attends annual eye exams? Dr Elicia Lamp  If pt is not established with a provider, would they like to be referred to a provider to establish care? No .   Dental Screening: Recommended annual dental exams for proper oral hygiene  Community Resource Referral / Chronic Care Management: CRR required this visit?  No   CCM required this visit?  No     Plan:     I have personally reviewed and noted the following in the patient's chart:   Medical and social history Use of alcohol, tobacco or illicit drugs  Current medications and supplements including opioid prescriptions. Patient is not currently taking opioid prescriptions. Functional ability and status Nutritional status Physical activity Advanced directives List of other physicians Hospitalizations, surgeries, and ER visits in previous 12 months Vitals Screenings to include cognitive, depression, and falls Referrals and appointments  In addition, I have reviewed and discussed with patient certain preventive protocols, quality metrics, and best practice recommendations. A written personalized care plan for preventive services as well as general preventive health recommendations were provided to patient.     Marzella Schlein, LPN   1/61/0960   After Visit Summary: (MyChart) Due to  this being a telephonic visit, the after visit summary with patients personalized plan was offered to patient via MyChart   Nurse Notes: none

## 2023-08-26 DIAGNOSIS — H00022 Hordeolum internum right lower eyelid: Secondary | ICD-10-CM | POA: Diagnosis not present

## 2023-09-02 ENCOUNTER — Encounter: Payer: Self-pay | Admitting: Family Medicine

## 2023-09-02 ENCOUNTER — Ambulatory Visit (INDEPENDENT_AMBULATORY_CARE_PROVIDER_SITE_OTHER): Admitting: Family Medicine

## 2023-09-02 VITALS — BP 121/71 | HR 79 | Temp 97.5°F | Ht 70.0 in | Wt 171.2 lb

## 2023-09-02 DIAGNOSIS — M25512 Pain in left shoulder: Secondary | ICD-10-CM | POA: Diagnosis not present

## 2023-09-02 DIAGNOSIS — F40298 Other specified phobia: Secondary | ICD-10-CM | POA: Diagnosis not present

## 2023-09-02 DIAGNOSIS — M7120 Synovial cyst of popliteal space [Baker], unspecified knee: Secondary | ICD-10-CM | POA: Insufficient documentation

## 2023-09-02 MED ORDER — MELOXICAM 15 MG PO TABS: 15.0000 mg | ORAL_TABLET | Freq: Every day | ORAL | 0 refills | Status: DC

## 2023-09-02 MED ORDER — DIAZEPAM 5 MG PO TABS: 5.0000 mg | ORAL_TABLET | Freq: Two times a day (BID) | ORAL | 0 refills | Status: DC | PRN

## 2023-09-02 MED ORDER — MUPIROCIN 2 % EX OINT: 1.0000 | TOPICAL_OINTMENT | Freq: Two times a day (BID) | CUTANEOUS | 0 refills | Status: DC

## 2023-09-02 NOTE — Assessment & Plan Note (Signed)
 Database without red flags.  Uses Valium very sporadically as needed prior to flights.  His last prescription for this was about 4 years ago.  Typically tolerates well without any significant side effects.  Will refill today.  He is aware of potential side effects.

## 2023-09-02 NOTE — Progress Notes (Signed)
 Jeremy Garcia is a 67 y.o. male who presents today for an office visit.  Assessment/Plan:  New/Acute Problems: Skin Irritation  Irritated area on posterior left ear likely related to abrasion from his glasses.  We can start topical mupirocin to treat any possible underlying infection.  Recommended that he look to see into getting his glasses refit to see if they will will fit more comfortably.  He will let us know if not improving.  Elevated blood pressure Initially elevated however at goal on recheck.  He will monitor at home and let us know if persistently elevated.  Chronic Problems Addressed Today: Left shoulder pain No red flags.  Consistent with previous rotator cuff flares.  Will start meloxicam.  We did discuss referral to PT however he deferred for today.  He will let us know if not improving the next 1 to 2 weeks and we can refer at that time.  We discussed reasons to return to care.  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  He will follow-up with orthopedics and to discuss next steps in management.  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  Uses Valium very sporadically as needed prior to flights.  His last prescription for this was about 4 years ago.  Typically tolerates well without any significant side effects.  Will refill today.  He is aware of potential side effects.     Subjective:  HPI:  See Assessment / plan for status of chronic conditions.Patient here with a few issues that he would like to discuss today.  Has had ongoing issues with bilateral knee pain.  This has been going on for several months.  He did see a different provider for this here several months ago.  Ultimately saw orthopedics and was diagnosed with Baker's cyst.  Underwent aspiration a few months ago.  Did well with this however since then has had return of pain in his right knee.  Is also developed a nodule in the back of his left knee and is concerned that he may be  developing a Baker's cyst in that area as well.  He is considering potential surgical options for this.  He also has had intermittent issues with left shoulder pain for the last several years.  He initially injured it with weight lifting in his 30s.  Since then has had intermittent flares.  Last that was about 5 years ago or so.  He has been under multiple rounds of physical therapy.  Most recently he did also flareup the pain in his left shoulder the past several days after stacking wood at home.  He has had worse pain with certain motions.  He would like to try an anti-inflammatory for this.  He has also had a sore spot on the back of his left ear.  This has been there for the last few weeks.  He has noticed that his glasses tender only area.  No treatments tried for this.       Objective:  Physical Exam: BP 121/71   Pulse 79   Temp (!) 97.5 F (36.4 C) (Temporal)   Ht 5\' 10"  (1.778 m)   Wt 171 lb 3.2 oz (77.7 kg)   SpO2 97%   BMI 24.56 kg/m   Gen: No acute distress, resting comfortably Skin: Erythematous area along posterior aspect of left ear. Neuro: Grossly normal, moves all extremities MUSCULOSKELETAL: Fullness noted at bilateral popliteal fossa right worse than left.  Neurovascular intact distally. Psych: Normal affect and thought  content      Tamyrah Burbage M. [name], MD 09/02/2023 10:53 AM

## 2023-09-02 NOTE — Assessment & Plan Note (Signed)
 Exam consistent with recurrent Baker's cyst.  He will follow-up with orthopedics and to discuss next steps in management.

## 2023-09-02 NOTE — Patient Instructions (Signed)
 It was very nice to see you today!  Please schedule appoint with Nehemiah Settle soon for your knees.  Take the meloxicam for your shoulder.  Work on your home exercises.  Let us know if not improving and we can refer you to see physical therapy.  You can use mupirocin on your ear.  I will refill the Valium today.  Please monitor your blood pressure and let us know if it is persistently elevated at home.  Return if symptoms worsen or fail to improve.   Take care, Dr [name]  PLEASE NOTE:  If you had any lab tests, please let us know if you have not heard back within a few days. You may see your results on mychart before we have a chance to review them but we will give you a call once they are reviewed by [place].   If we ordered any referrals today, please let us know if you have not heard from their office within the next week.   If you had any urgent prescriptions sent in today, please check with the pharmacy within an hour of our visit to make sure the prescription was transmitted appropriately.   Please try these tips to maintain a healthy lifestyle:  Eat at least 3 REAL meals and 1-2 snacks per day.  Aim for no more than 5 hours between eating.  If you eat breakfast, please do so within one hour of getting up.   Each meal should contain half fruits/vegetables, one quarter protein, and one quarter carbs (no bigger than a computer mouse)  Cut down on sweet beverages. This includes juice, soda, and sweet tea.   Drink at least 1 glass of water with each meal and aim for at least 8 glasses per day  Exercise at least 150 minutes every week.

## 2023-09-02 NOTE — Assessment & Plan Note (Signed)
 No red flags.  Consistent with previous rotator cuff flares.  Will start meloxicam.  We did discuss referral to PT however he deferred for today.  He will let us know if not improving the next 1 to 2 weeks and we can refer at that time.  We discussed reasons to return to care.

## 2023-09-16 DIAGNOSIS — H00011 Hordeolum externum right upper eyelid: Secondary | ICD-10-CM | POA: Diagnosis not present

## 2023-09-29 ENCOUNTER — Other Ambulatory Visit: Payer: Self-pay | Admitting: Family Medicine

## 2023-10-07 DIAGNOSIS — H00011 Hordeolum externum right upper eyelid: Secondary | ICD-10-CM | POA: Diagnosis not present

## 2023-10-29 ENCOUNTER — Other Ambulatory Visit: Payer: Self-pay | Admitting: Family Medicine

## 2023-11-20 ENCOUNTER — Ambulatory Visit: Payer: Self-pay

## 2023-11-20 NOTE — Telephone Encounter (Signed)
 FYI Only or Action Required?: FYI only for provider.  Patient was last seen in primary care on 09/02/2023 by Jeremy Clamp, MD. Called Nurse Triage reporting Toe Pain. Symptoms began several weeks ago. Interventions attempted: Nothing. Symptoms are: unchanged.  Triage Disposition: See PCP Within 2 Weeks  Patient/caregiver understands and will follow disposition?: Yes, will follow disposition  Copied from CRM [id number]. Topic: Clinical - Red Word Triage >> Nov 20, 2023 11:13 AM Mesmerise C wrote: [facility] that prompted transfer to Nurse Triage: Patient big toes showing infection about mild pain on left toe been ongoing for several weeks now. Both toenails have bruising underneath. Reason for Disposition  [1] MILD pain (e.g., does not interfere with normal activities) AND [2] present > 7 days  Answer Assessment - Initial Assessment Questions 1. ONSET: When did the pain start?      Couple weeks 2. LOCATION: Where is the pain located?   (e.g., around nail, entire toe, at foot joint)      Big toe bruising 3. PAIN: How bad is the pain?    (Scale 1-10; or mild, moderate, severe)   -  MILD (1-3): doesn't interfere with normal activities    -  MODERATE (4-7): interferes with normal activities (e.g., work or school) or awakens from sleep, limping    -  SEVERE (8-10): excruciating pain, unable to do any normal activities, unable to walk     mild 4. APPEARANCE: What does the toe look like? (e.g., redness, swelling, bruising, pallor)     Redness to the L great toe 5. CAUSE: What do you think is causing the toe pain?     Ongoing problems, probably started with an ingrown toenail 6. OTHER SYMPTOMS: Do you have any other symptoms? (e.g., leg pain, rash, fever, numbness)     denies  Protocols used: Toe Pain-A-AH

## 2023-11-20 NOTE — Telephone Encounter (Signed)
 Appt tomorrow.

## 2023-11-21 ENCOUNTER — Ambulatory Visit (INDEPENDENT_AMBULATORY_CARE_PROVIDER_SITE_OTHER): Admitting: Family Medicine

## 2023-11-21 ENCOUNTER — Ambulatory Visit

## 2023-11-21 ENCOUNTER — Encounter: Payer: Self-pay | Admitting: Family Medicine

## 2023-11-21 VITALS — BP 128/78 | HR 75 | Temp 98.4°F | Ht 70.0 in | Wt 170.6 lb

## 2023-11-21 DIAGNOSIS — B351 Tinea unguium: Secondary | ICD-10-CM | POA: Diagnosis not present

## 2023-11-21 DIAGNOSIS — M25512 Pain in left shoulder: Secondary | ICD-10-CM | POA: Diagnosis not present

## 2023-11-21 MED ORDER — TERBINAFINE HCL 250 MG PO TABS: 250.0000 mg | ORAL_TABLET | Freq: Every day | ORAL | 0 refills | Status: AC

## 2023-11-21 NOTE — Assessment & Plan Note (Signed)
 Patient with bilateral onychomycosis in both great toenails.  Also does have ingrown toenails bilaterally as well.  We discussed treatment options including referral to podiatry for avulsion and matrixectomy.  We also discussed course of antifungals to see if this will help improve his symptoms before pursuing more aggressive alternatives.  He is agreeable to trial of terbinafine at this point.  We discussed potential side effects.  He will come back in a couple of months for CPE and we can check labs at that point though he has never had any issues with LFT elevation in the past.  He will let us  know if he would like to see a podiatrist between now and our next visit.

## 2023-11-21 NOTE — Patient Instructions (Signed)
 It was very nice to see you today!  You have mild toenail fungus.  This is called onychomycosis.  Will start terbinafine for 3 months.  Hopefully this will help with the ingrown toenails.  Please continue what you are doing to help manage these.  Let us  know if you would like a referral to see a podiatrist for permanent nail removal.  Please come back soon for the x-ray of your shoulder.  Return if symptoms worsen or fail to improve.   Take care, Dr Daneil Dunker  PLEASE NOTE:  If you had any lab tests, please let us  know if you have not heard back within a few days. You may see your results on mychart before we have a chance to review them but we will give you a call once they are reviewed by us .   If we ordered any referrals today, please let us  know if you have not heard from their office within the next week.   If you had any urgent prescriptions sent in today, please check with the pharmacy within an hour of our visit to make sure the prescription was transmitted appropriately.   Please try these tips to maintain a healthy lifestyle:  Eat at least 3 REAL meals and 1-2 snacks per day.  Aim for no more than 5 hours between eating.  If you eat breakfast, please do so within one hour of getting up.   Each meal should contain half fruits/vegetables, one quarter protein, and one quarter carbs (no bigger than a computer mouse)  Cut down on sweet beverages. This includes juice, soda, and sweet tea.   Drink at least 1 glass of water with each meal and aim for at least 8 glasses per day  Exercise at least 150 minutes every week.

## 2023-11-21 NOTE — Assessment & Plan Note (Signed)
 Symptoms are improving with home exercises.  Has meloxicam  to use as needed sporadically though would like to avoid long-term use of this.  We did discuss referral to PT however he would like to hold off on this for now.  He will come back soon for x-ray-order was placed today.

## 2023-11-21 NOTE — Progress Notes (Signed)
   Jeremy Garcia is a 68 y.o. male who presents today for an office visit.  Assessment/Plan:  Chronic Problems Addressed Today: Onychomycosis Patient with bilateral onychomycosis in both great toenails.  Also does have ingrown toenails bilaterally as well.  We discussed treatment options including referral to podiatry for avulsion and matrixectomy.  We also discussed course of antifungals to see if this will help improve his symptoms before pursuing more aggressive alternatives.  He is agreeable to trial of terbinafine at this point.  We discussed potential side effects.  He will come back in a couple of months for CPE and we can check labs at that point though he has never had any issues with LFT elevation in the past.  He will let us  know if he would like to see a podiatrist between now and our next visit.  Left shoulder pain Symptoms are improving with home exercises.  Has meloxicam  to use as needed sporadically though would like to avoid long-term use of this.  We did discuss referral to PT however he would like to hold off on this for now.  He will come back soon for x-ray-order was placed today.     Subjective:  HPI:  See A/P for status of chronic conditions.  Patient here today with bilateral toe pain.  He has had intermittent pain in his toes for several years due to ingrown toenails.  He recently started developing pain in his left toe a few weeks ago on vacation in [place].  Did a lot of walking while there and noticed a blister at the distal aspect of his left toe.  Since then he has had moderate mount of pain the area.  He typically manages the ingrown toenails at home with lifting the nail bed with cotton swabs.       Objective:  Physical Exam: BP 128/78   Pulse 75   Temp 98.4 F (36.9 C) (Temporal)   Ht 5' 10 (1.778 m)   Wt 170 lb 9.6 oz (77.4 kg)   SpO2 96%   BMI 24.48 kg/m   Gen: No acute distress, resting comfortably CV: Regular rate and rhythm with no murmurs  appreciated Pulm: Normal work of breathing, clear to auscultation bilaterally with no crackles, wheezes, or rhonchi MUSCULOSKELETAL - Left Foot: Onychomycosis noted at great toenail.  Onycholysis noted along nailbed as well as ingrown toenail along medial and lateral edges - Right foot: Onychomycosis noted at great toenail.  Subungual ecchymosis on proximal nailbed as well as ingrown toenail medial and lateral edges. Neuro: Grossly normal, moves all extremities Psych: Normal affect and thought content      Jeremy Groleau M. Daneil Dunker, MD 11/21/2023 11:53 AM

## 2023-11-25 ENCOUNTER — Other Ambulatory Visit: Payer: Self-pay | Admitting: Family Medicine

## 2024-01-06 DIAGNOSIS — D2261 Melanocytic nevi of right upper limb, including shoulder: Secondary | ICD-10-CM | POA: Diagnosis not present

## 2024-01-06 DIAGNOSIS — D692 Other nonthrombocytopenic purpura: Secondary | ICD-10-CM | POA: Diagnosis not present

## 2024-01-06 DIAGNOSIS — B351 Tinea unguium: Secondary | ICD-10-CM | POA: Diagnosis not present

## 2024-01-06 DIAGNOSIS — D225 Melanocytic nevi of trunk: Secondary | ICD-10-CM | POA: Diagnosis not present

## 2024-01-06 DIAGNOSIS — D1801 Hemangioma of skin and subcutaneous tissue: Secondary | ICD-10-CM | POA: Diagnosis not present

## 2024-01-06 DIAGNOSIS — D2262 Melanocytic nevi of left upper limb, including shoulder: Secondary | ICD-10-CM | POA: Diagnosis not present

## 2024-01-06 DIAGNOSIS — L570 Actinic keratosis: Secondary | ICD-10-CM | POA: Diagnosis not present

## 2024-01-06 DIAGNOSIS — L821 Other seborrheic keratosis: Secondary | ICD-10-CM | POA: Diagnosis not present

## 2024-01-06 DIAGNOSIS — L814 Other melanin hyperpigmentation: Secondary | ICD-10-CM | POA: Diagnosis not present

## 2024-02-03 ENCOUNTER — Ambulatory Visit (INDEPENDENT_AMBULATORY_CARE_PROVIDER_SITE_OTHER): Payer: PPO | Admitting: Family Medicine

## 2024-02-03 ENCOUNTER — Encounter: Payer: Self-pay | Admitting: Family Medicine

## 2024-02-03 VITALS — BP 136/80 | HR 66 | Temp 97.5°F | Ht 70.0 in | Wt 176.8 lb

## 2024-02-03 DIAGNOSIS — B351 Tinea unguium: Secondary | ICD-10-CM | POA: Diagnosis not present

## 2024-02-03 DIAGNOSIS — E785 Hyperlipidemia, unspecified: Secondary | ICD-10-CM

## 2024-02-03 DIAGNOSIS — Z125 Encounter for screening for malignant neoplasm of prostate: Secondary | ICD-10-CM | POA: Diagnosis not present

## 2024-02-03 DIAGNOSIS — Z0001 Encounter for general adult medical examination with abnormal findings: Secondary | ICD-10-CM

## 2024-02-03 DIAGNOSIS — R739 Hyperglycemia, unspecified: Secondary | ICD-10-CM | POA: Diagnosis not present

## 2024-02-03 LAB — COMPREHENSIVE METABOLIC PANEL WITH GFR
ALT: 20 U/L (ref 0–53)
AST: 23 U/L (ref 0–37)
Albumin: 4.6 g/dL (ref 3.5–5.2)
Alkaline Phosphatase: 76 U/L (ref 39–117)
BUN: 18 mg/dL (ref 6–23)
CO2: 28 meq/L (ref 19–32)
Calcium: 9 mg/dL (ref 8.4–10.5)
Chloride: 105 meq/L (ref 96–112)
Creatinine, Ser: 1.01 mg/dL (ref 0.40–1.50)
GFR: 76.55 mL/min (ref >60.00)
Glucose, Bld: 94 mg/dL (ref 70–99)
Potassium: 4.4 meq/L (ref 3.5–5.1)
Sodium: 143 meq/L (ref 135–145)
Total Bilirubin: 0.7 mg/dL (ref 0.2–1.2)
Total Protein: 6.8 g/dL (ref 6.0–8.3)

## 2024-02-03 LAB — CBC
HCT: 50.3 % (ref 39.0–52.0)
Hemoglobin: 17 g/dL (ref 13.0–17.0)
MCHC: 33.7 g/dL (ref 30.0–36.0)
MCV: 92.1 fl (ref 78.0–100.0)
Platelets: 203 K/uL (ref 150.0–400.0)
RBC: 5.47 Mil/uL (ref 4.22–5.81)
RDW: 13.1 % (ref 11.5–15.5)
WBC: 4.9 K/uL (ref 4.0–10.5)

## 2024-02-03 LAB — LIPID PANEL
Cholesterol: 180 mg/dL (ref 0–200)
HDL: 58.9 mg/dL (ref >39.00)
LDL Cholesterol: 103 mg/dL — ABNORMAL HIGH (ref 0–99)
NonHDL: 121.35
Total CHOL/HDL Ratio: 3
Triglycerides: 93 mg/dL (ref 0.0–149.0)
VLDL: 18.6 mg/dL (ref 0.0–40.0)

## 2024-02-03 LAB — PSA: PSA: 0.67 ng/mL (ref 0.10–4.00)

## 2024-02-03 LAB — TSH: TSH: 1.68 u[IU]/mL (ref 0.35–5.50)

## 2024-02-03 LAB — HEMOGLOBIN A1C: Hgb A1c MFr Bld: 5.7 % (ref 4.6–6.5)

## 2024-02-03 NOTE — Progress Notes (Signed)
 Chief Complaint:  Jeremy Garcia is a 68 y.o. male who presents today for his annual comprehensive physical exam.    Assessment/Plan:  Elevated blood pressure reading Initially elevated however at goal recheck.  Typically well-controlled.  Recommend he monitor at home periodically.  He will let us  know if persistently elevated.   Chronic Problems Addressed Today: Dyslipidemia Check lipids.  Discussed lifestyle modifications.  Hyperglycemia Check A1c.  Discussed lifestyle modifications.  Onychomycosis Patient still has about a month left of terbinafine .  His left toenail spontaneously avulsed several weeks ago.  He does have some proximal nail clearing from the terbinafine .  We did discuss referral to podiatry however we will continue with watchful waiting for now.  We did discuss it would take several months before he notices that he big difference.  He will let us  know if he would like to be referred to see podiatry.     Preventative Healthcare: Check labs.  Patient Counseling(The following topics were reviewed and/or handout was given):  -Nutrition: Stressed importance of moderation in sodium/caffeine intake, saturated fat and cholesterol, caloric balance, sufficient intake of fresh fruits, vegetables, and fiber.  -Stressed the importance of regular exercise.   -Substance Abuse: Discussed cessation/primary prevention of tobacco, alcohol, or other drug use; driving or other dangerous activities under the influence; availability of treatment for abuse.   -Injury prevention: Discussed safety belts, safety helmets, smoke detector, smoking near bedding or upholstery.   -Sexuality: Discussed sexually transmitted diseases, partner selection, use of condoms, avoidance of unintended pregnancy and contraceptive alternatives.   -Dental health: Discussed importance of regular tooth brushing, flossing, and dental visits.  -Health maintenance and immunizations reviewed. Please refer to Health  maintenance section.  Return to care in 1 year for next preventative visit.     Subjective:  HPI:  He has no acute complaints today. Patient is here today for his  annual physical.  See assessment / plan for status of chronic conditions.  Discussed the use of AI scribe software for clinical note transcription with the patient, who gave verbal consent to proceed.  History of Present Illness Jeremy Garcia is a 68 year old male who presents with concerns about toenail issues and follow-up on a Baker's cyst.  He has ongoing issues with his toenails, which he previously consulted a dermatologist about. He has been on terbinafine  for the past few months. One of his toenails fell off approximately one and a half to two weeks after starting the medication, but it has started to regrow. The dermatologist informed him that the condition might not clear up completely, with only a 30% chance of resolution.   He has increased his physical activity, including upper body workouts three times a week and regular biking and walking. He is mindful of his diet, ensuring he consumes plenty of fruits and vegetables, and has lost six pounds since last year.     Lifestyle Diet: Healthy.  Plenty of fruits and veggies Exercise: Works out 3 times per week.  Regularly walks and rides bike.     02/03/2024    8:26 AM  Depression screen PHQ 2/9  Decreased Interest 0  Down, Depressed, Hopeless 0  PHQ - 2 Score 0    There are no preventive care reminders to display for this patient.   ROS: Per HPI, otherwise a complete review of systems was negative.   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   Hyperglycemia 01/23/2021   Dyslipidemia 12/09/2019   GERD (gastroesophageal reflux disease) 12/08/2019   Tremor 12/08/2019   History of pulmonary embolism 09/14/2019   Spondylolisthesis of lumbar region 08/19/2019   Erectile dysfunction 12/01/2018   Allergic rhinitis 12/01/2018   Past Surgical History:  Procedure Laterality Date   ANTERIOR LAT LUMBAR FUSION N/A 08/19/2019   Procedure: Lumbar three- Anterolateral lumbar interbody fusion with lateral plate fixation;  Surgeon: Colon Shove, MD;  Location: [facility] OR;  Service: Neurosurgery;  Laterality: N/A;   BACK SURGERY     Due to sciatica   KNEE CARTILAGE SURGERY     Arthroscopy    Family History  Problem Relation Age of Onset   Hypertension Brother    Prostate cancer Father        60s   Coronary artery disease Mother     Medications- reviewed and updated Current Outpatient Medications  Medication Sig Dispense Refill   loratadine  (CLARITIN ) 10 MG tablet Take 10 mg by mouth daily.     meloxicam  (MOBIC ) 15 MG tablet TAKE 1 TABLET (15 MG TOTAL) BY MOUTH DAILY. 30 tablet 0   terbinafine  (LAMISIL ) 250 MG tablet Take 1 tablet (250 mg total) by mouth daily. 90 tablet 0   No current facility-administered medications for this visit.    Allergies-reviewed and updated No Known Allergies  Social History   Socioeconomic History   Marital status: Married    Spouse name: Not on file   Number of children: Not on file   Years of education: Not on file   Highest education level: Master's degree (e.g., MA, MS, MEng, MEd, MSW, MBA)  Occupational History   Occupation: Transport planner  Tobacco Use   Smoking status: Never   Smokeless tobacco: Never  Vaping Use   Vaping status: Never Used  Substance and Sexual Activity   Alcohol use: Yes    Alcohol/week: 1.0 standard drink of alcohol    Types: 1 Standard drinks or equivalent per week   Drug use: Never   Sexual activity: Yes  Other Topics  Concern   Not on file  Social History Narrative   Not on file   Social Drivers of Health   Financial Resource Strain: Low Risk  (11/20/2023)   Overall Financial Resource Strain (CARDIA)    Difficulty of Paying Living Expenses: Not hard at all  Food Insecurity: No Food Insecurity (11/20/2023)   Hunger Vital Sign    Worried About Running Out of Food in the Last Year: Never true    Ran Out of Food in the Last Year: Never true  Transportation Needs: No Transportation Needs (11/20/2023)   PRAPARE - Administrator, Civil Service (Medical): No    Lack of Transportation (Non-Medical): No  Physical Activity: Sufficiently Active (11/20/2023)   Exercise Vital Sign    Days of Exercise per Week: 7 days    Minutes of Exercise per Session: 30 min  Stress: No Stress Concern Present (11/20/2023)   [employer] of Occupational Health - Occupational Stress Questionnaire    Feeling of Stress: Not at all  Social Connections: Moderately Integrated (11/20/2023)   Social Connection and Isolation Panel    Frequency of Communication  with Friends and Family: More than three times a week    Frequency of Social Gatherings with Friends and Family: Three times a week    Attends Religious Services: More than 4 times per year    Active Member of Clubs or Organizations: No    Attends Engineer, structural: Not on file    Marital Status: Married        Objective:  Physical Exam: BP (!) 155/80   Pulse 66   Temp (!) 97.5 F (36.4 C) (Temporal)   Ht 5' 10 (1.778 m)   Wt 176 lb 12.8 oz (80.2 kg)   SpO2 97%   BMI 25.37 kg/m   Body mass index is 25.37 kg/m. Wt Readings from Last 3 Encounters:  02/03/24 176 lb 12.8 oz (80.2 kg)  11/21/23 170 lb 9.6 oz (77.4 kg)  09/02/23 171 lb 3.2 oz (77.7 kg)   Gen: NAD, resting comfortably HEENT: TMs normal bilaterally. OP clear. No thyromegaly noted.  CV: RRR with no murmurs appreciated Pulm: NWOB, CTAB with no crackles, wheezes, or rhonchi GI:  Normal bowel sounds present. Soft, Nontender, Nondistended. MSK: no edema, cyanosis, or clubbing noted.  Onychomycosis bilateral great toenails.  Avulsion on left great toenail that appears to be healing normally.  Clearing noted at base of right great toenail. Skin: warm, dry Neuro: CN2-12 grossly intact. Strength 5/5 in upper and lower extremities. Reflexes symmetric and intact bilaterally.  Psych: Normal affect and thought content     Jeremy Garcia M. Kennyth, MD 02/03/2024 9:00 AM

## 2024-02-03 NOTE — Assessment & Plan Note (Signed)
 Check A1c.  Discussed lifestyle modifications.

## 2024-02-03 NOTE — Assessment & Plan Note (Signed)
 Patient still has about a month left of terbinafine .  His left toenail spontaneously avulsed several weeks ago.  He does have some proximal nail clearing from the terbinafine .  We did discuss referral to podiatry however we will continue with watchful waiting for now.  We did discuss it would take several months before he notices that he big difference.  He will let us  know if he would like to be referred to see podiatry.

## 2024-02-03 NOTE — Assessment & Plan Note (Signed)
 Check lipids. Discussed lifestyle modifications.

## 2024-02-03 NOTE — Patient Instructions (Signed)
 It was very nice to see you today!  VISIT SUMMARY: Today, we discussed your toenail issues and follow-up on your Baker's cyst. Your blood pressure was elevated, but you have been maintaining a healthy lifestyle with regular exercise and a balanced diet. We also reviewed your recent weight loss and upcoming travel plans.  YOUR PLAN: ADULT WELLNESS VISIT: Your blood pressure was elevated today, but it has been normal in the past. You have lost 6 pounds and are engaging in regular exercise and a healthy diet. -Recheck your blood pressure before leaving today. -We will order blood tests including CBC, kidney function, liver function, and PSA. -Continue your current diet and exercise regimen. -Schedule a follow-up visit in one year.  ONYCHOMYCOSIS (TOENAIL FUNGAL INFECTION): You have been on terbinafine  treatment for your toenail fungal infection, with one month remaining. One toenail is regrowing, and another is improving. -Continue taking terbinafine  as prescribed. -Monitor the regrowth and condition of your toenails. -Consider seeing a podiatrist if there is no improvement.  Return in about 1 year (around 02/02/2025).   Take care, Dr Kennyth  PLEASE NOTE:  If you had any lab tests, please let us  know if you have not heard back within a few days. You may see your results on mychart before we have a chance to review them but we will give you a call once they are reviewed by us .   If we ordered any referrals today, please let us  know if you have not heard from their office within the next week.   If you had any urgent prescriptions sent in today, please check with the pharmacy within an hour of our visit to make sure the prescription was transmitted appropriately.   Please try these tips to maintain a healthy lifestyle:  Eat at least 3 REAL meals and 1-2 snacks per day.  Aim for no more than 5 hours between eating.  If you eat breakfast, please do so within one hour of getting up.   Each  meal should contain half fruits/vegetables, one quarter protein, and one quarter carbs (no bigger than a computer mouse)  Cut down on sweet beverages. This includes juice, soda, and sweet tea.   Drink at least 1 glass of water with each meal and aim for at least 8 glasses per day  Exercise at least 150 minutes every week.    Preventive Care 58 Years and Older, Male Preventive care refers to lifestyle choices and visits with your health care provider that can promote health and wellness. Preventive care visits are also called wellness exams. What can I expect for my preventive care visit? Counseling During your preventive care visit, your health care provider may ask about your: Medical history, including: Past medical problems. Family medical history. History of falls. Current health, including: Emotional well-being. Home life and relationship well-being. Sexual activity. Memory and ability to understand (cognition). Lifestyle, including: Alcohol, nicotine or tobacco, and drug use. Access to firearms. Diet, exercise, and sleep habits. Work and work Astronomer. Sunscreen use. Safety issues such as seatbelt and bike helmet use. Physical exam Your health care provider will check your: Height and weight. These may be used to calculate your BMI (body mass index). BMI is a measurement that tells if you are at a healthy weight. Waist circumference. This measures the distance around your waistline. This measurement also tells if you are at a healthy weight and may help predict your risk of certain diseases, such as type 2 diabetes and high blood pressure.  Heart rate and blood pressure. Body temperature. Skin for abnormal spots. What immunizations do I need?  Vaccines are usually given at various ages, according to a schedule. Your health care provider will recommend vaccines for you based on your age, medical history, and lifestyle or other factors, such as travel or where you  work. What tests do I need? Screening Your health care provider may recommend screening tests for certain conditions. This may include: Lipid and cholesterol levels. Diabetes screening. This is done by checking your blood sugar (glucose) after you have not eaten for a while (fasting). Hepatitis C test. Hepatitis B test. HIV (human immunodeficiency virus) test. STI (sexually transmitted infection) testing, if you are at risk. Lung cancer screening. Colorectal cancer screening. Prostate cancer screening. Abdominal aortic aneurysm (AAA) screening. You may need this if you are a current or former smoker. Talk with your health care provider about your test results, treatment options, and if necessary, the need for more tests. Follow these instructions at home: Eating and drinking  Eat a diet that includes fresh fruits and vegetables, whole grains, lean protein, and low-fat dairy products. Limit your intake of foods with high amounts of sugar, saturated fats, and salt. Take vitamin and mineral supplements as recommended by your health care provider. Do not drink alcohol if your health care provider tells you not to drink. If you drink alcohol: Limit how much you have to 0-2 drinks a day. Know how much alcohol is in your drink. In the [place] one drink equals one 12 oz bottle of beer (355 mL), one 5 oz glass of wine (148 mL), or one 1 oz glass of hard liquor (44 mL). Lifestyle Brush your teeth every morning and night with fluoride toothpaste. Floss one time each day. Exercise for at least 30 minutes 5 or more days each week. Do not use any products that contain nicotine or tobacco. These products include cigarettes, chewing tobacco, and vaping devices, such as e-cigarettes. If you need help quitting, ask your health care provider. Do not use drugs. If you are sexually active, practice safe sex. Use a condom or other form of protection to prevent STIs. Take aspirin only as told by your health  care provider. Make sure that you understand how much to take and what form to take. Work with your health care provider to find out whether it is safe and beneficial for you to take aspirin daily. Ask your health care provider if you need to take a cholesterol-lowering medicine (statin). Find healthy ways to manage stress, such as: Meditation, yoga, or listening to music. Journaling. Talking to a trusted person. Spending time with friends and family. Safety Always wear your seat belt while driving or riding in a vehicle. Do not drive: If you have been drinking alcohol. Do not ride with someone who has been drinking. When you are tired or distracted. While texting. If you have been using any mind-altering substances or drugs. Wear a helmet and other protective equipment during sports activities. If you have firearms in your house, make sure you follow all gun safety procedures. Minimize exposure to UV radiation to reduce your risk of skin cancer. What's next? Visit your health care provider once a year for an annual wellness visit. Ask your health care provider how often you should have your eyes and teeth checked. Stay up to date on all vaccines. This information is not intended to replace advice given to you by your health care provider. Make sure you discuss any  questions you have with your health care provider. Document Revised: 11/15/2020 Document Reviewed: 11/15/2020 Elsevier Patient Education  2024 [employer].

## 2024-02-05 ENCOUNTER — Ambulatory Visit: Payer: Self-pay | Admitting: Family Medicine

## 2024-02-05 DIAGNOSIS — E785 Hyperlipidemia, unspecified: Secondary | ICD-10-CM

## 2024-02-05 NOTE — Progress Notes (Signed)
 His cholesterol and blood sugar are borderline elevated but overall stable.  Do not need to start meds for either of these however he should continue to work on diet and exercise and can recheck again in a year.  The rest of his labs are all at goal.

## 2024-02-07 ENCOUNTER — Other Ambulatory Visit: Payer: Self-pay | Admitting: Family Medicine

## 2024-04-05 ENCOUNTER — Encounter: Payer: Self-pay | Admitting: Radiology

## 2024-07-01 ENCOUNTER — Ambulatory Visit: Payer: PPO

## 2024-07-08 ENCOUNTER — Ambulatory Visit

## 2024-07-12 ENCOUNTER — Ambulatory Visit

## 2025-02-04 ENCOUNTER — Encounter: Admitting: Family Medicine
